# Patient Record
Sex: Female | Born: 1965
Health system: Southern US, Community
[De-identification: ages and names within clinical notes are randomized; demographics above are authoritative.]

## PROBLEM LIST (undated history)

## (undated) DIAGNOSIS — I10 Essential (primary) hypertension: Secondary | ICD-10-CM

## (undated) DIAGNOSIS — E7800 Pure hypercholesterolemia, unspecified: Secondary | ICD-10-CM

## (undated) DIAGNOSIS — K219 Gastro-esophageal reflux disease without esophagitis: Secondary | ICD-10-CM

## (undated) HISTORY — DX: Pure hypercholesterolemia, unspecified: E78.00

## (undated) HISTORY — DX: Essential (primary) hypertension: I10

---

## 2000-07-30 ENCOUNTER — Emergency Department (HOSPITAL_COMMUNITY): Admission: EM | Admit: 2000-07-30 | Discharge: 2000-07-30 | Payer: Self-pay | Admitting: Emergency Medicine

## 2003-10-15 ENCOUNTER — Emergency Department (HOSPITAL_COMMUNITY): Admission: EM | Admit: 2003-10-15 | Discharge: 2003-10-15 | Payer: Self-pay | Admitting: *Deleted

## 2003-11-18 ENCOUNTER — Encounter: Admission: RE | Admit: 2003-11-18 | Discharge: 2004-01-26 | Payer: Self-pay | Admitting: Orthopedic Surgery

## 2007-12-18 ENCOUNTER — Other Ambulatory Visit: Admission: RE | Admit: 2007-12-18 | Discharge: 2007-12-18 | Payer: Self-pay | Admitting: Obstetrics and Gynecology

## 2010-01-24 ENCOUNTER — Other Ambulatory Visit: Admission: RE | Admit: 2010-01-24 | Discharge: 2010-01-24 | Payer: Self-pay | Admitting: Obstetrics and Gynecology

## 2010-02-22 ENCOUNTER — Ambulatory Visit (HOSPITAL_COMMUNITY)
Admission: RE | Admit: 2010-02-22 | Discharge: 2010-02-22 | Payer: Self-pay | Source: Home / Self Care | Admitting: Obstetrics & Gynecology

## 2010-06-22 ENCOUNTER — Ambulatory Visit (HOSPITAL_COMMUNITY)
Admission: RE | Admit: 2010-06-22 | Discharge: 2010-06-22 | Payer: Self-pay | Source: Home / Self Care | Attending: Obstetrics & Gynecology | Admitting: Obstetrics & Gynecology

## 2010-07-11 ENCOUNTER — Encounter: Payer: Self-pay | Admitting: Obstetrics & Gynecology

## 2010-08-29 LAB — URINE MICROSCOPIC-ADD ON

## 2010-08-29 LAB — COMPREHENSIVE METABOLIC PANEL
Albumin: 3.7 g/dL (ref 3.5–5.2)
Alkaline Phosphatase: 147 U/L — ABNORMAL HIGH (ref 39–117)
BUN: 10 mg/dL (ref 6–23)
CO2: 26 mEq/L (ref 19–32)
Creatinine, Ser: 0.91 mg/dL (ref 0.4–1.2)
GFR calc non Af Amer: >60 mL/min (ref >60)
Total Bilirubin: 0.3 mg/dL (ref 0.3–1.2)

## 2010-08-29 LAB — CBC
Hemoglobin: 10.5 g/dL — ABNORMAL LOW (ref 12.0–15.0)
MCH: 24.3 pg — ABNORMAL LOW (ref 26.0–34.0)
MCHC: 33.1 g/dL (ref 30.0–36.0)
MCV: 73.4 fL — ABNORMAL LOW (ref 78.0–100.0)
RBC: 4.32 MIL/uL (ref 3.87–5.11)
WBC: 5.3 10*3/uL (ref 4.0–10.5)

## 2010-08-29 LAB — URINALYSIS, ROUTINE W REFLEX MICROSCOPIC
Bilirubin Urine: NEGATIVE
Glucose, UA: NEGATIVE mg/dL

## 2011-03-29 ENCOUNTER — Other Ambulatory Visit (HOSPITAL_COMMUNITY)
Admission: RE | Admit: 2011-03-29 | Discharge: 2011-03-29 | Disposition: A | Discharge status: Home / Self Care | Payer: Managed Care, Other (non HMO) | Source: Ambulatory Visit | Attending: Obstetrics and Gynecology | Admitting: Obstetrics and Gynecology

## 2011-03-29 ENCOUNTER — Other Ambulatory Visit: Payer: Self-pay | Admitting: Adult Health

## 2011-03-29 DIAGNOSIS — Z01419 Encounter for gynecological examination (general) (routine) without abnormal findings: Secondary | ICD-10-CM | POA: Insufficient documentation

## 2011-05-20 HISTORY — PX: HYSTEROSCOPY W/ ENDOMETRIAL ABLATION: SUR665

## 2012-04-25 IMAGING — US US TRANSVAGINAL NON-OB
1 series · 13 of 25 positions shown · non-contrast
Comparison: None.

CLINICAL DATA: History of dysfunctional uterine bleeding and
dysmenorrhea.



[Series 1: us transvaginal non-ob · 0.28mm/px · 13 of 96 slices shown]
[im 1/96]
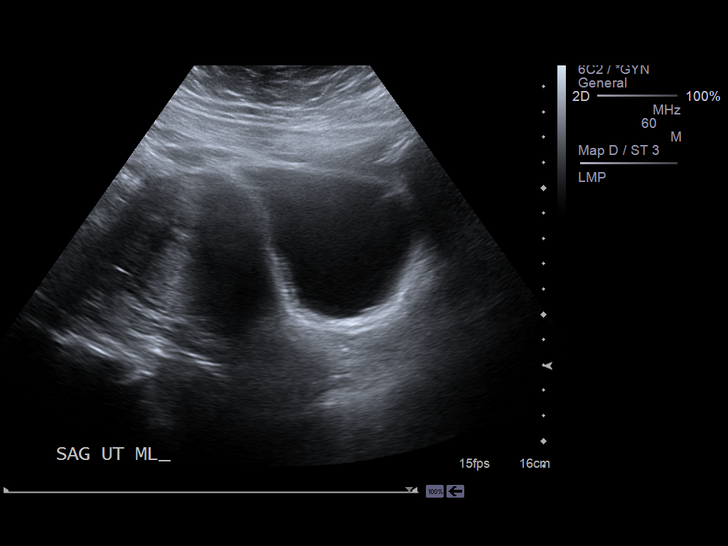
[im 8/96]
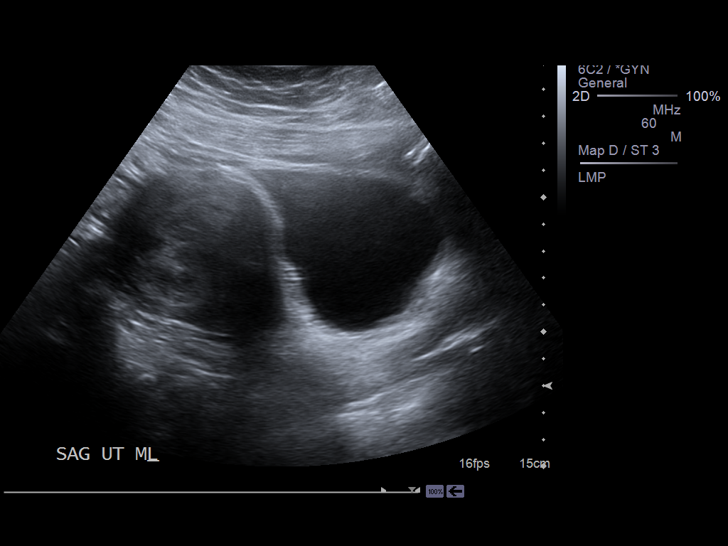
[im 16/96]
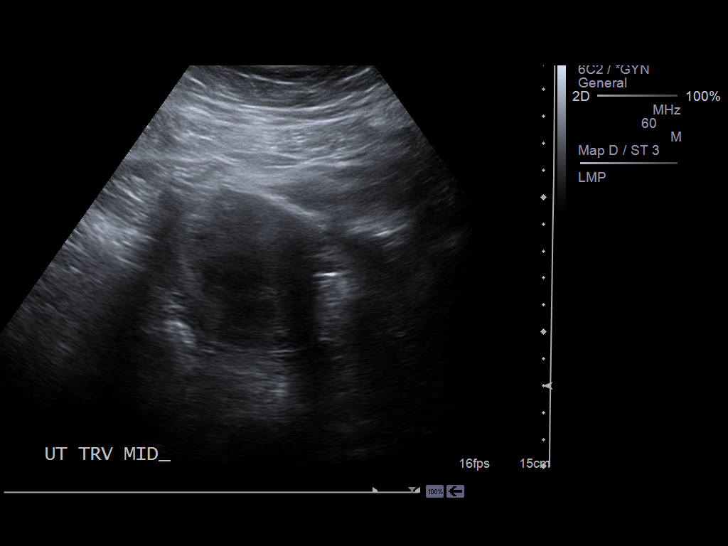
[im 24/96]
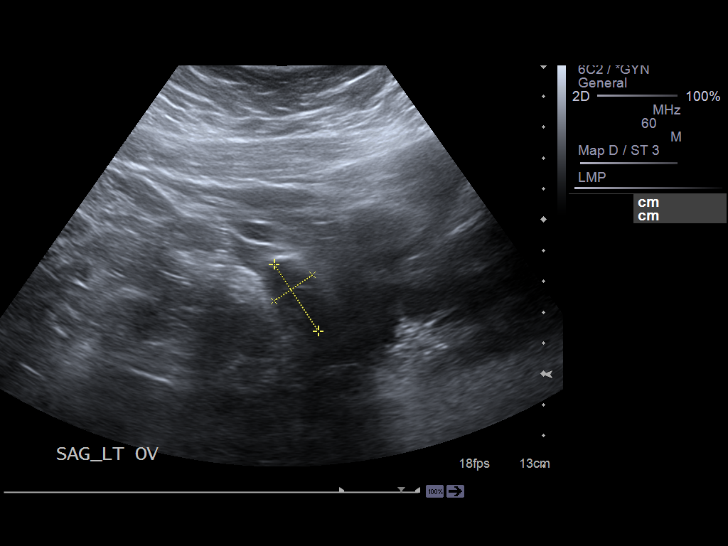
[im 32/96]
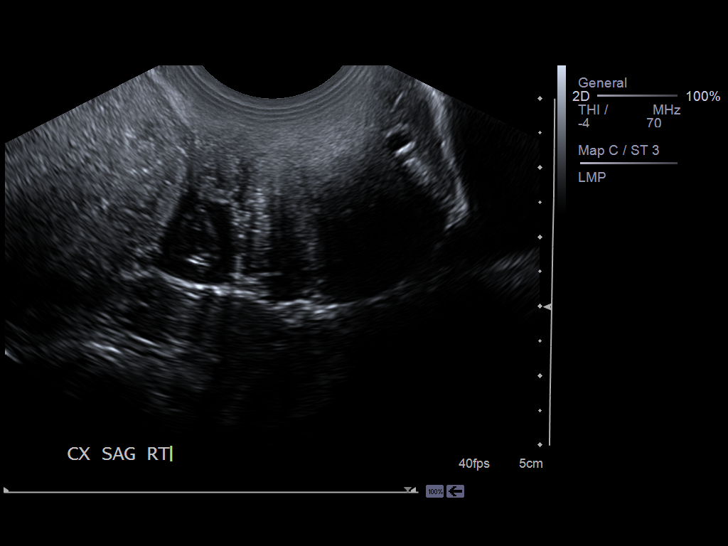
[im 40/96]
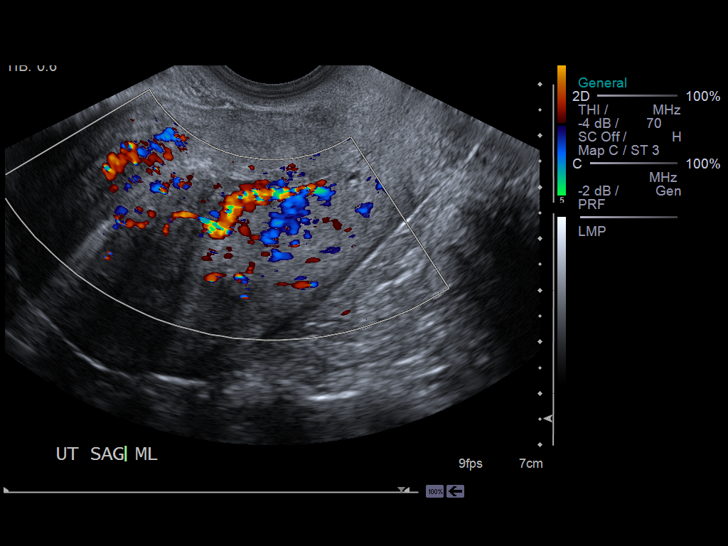
[im 48/96]
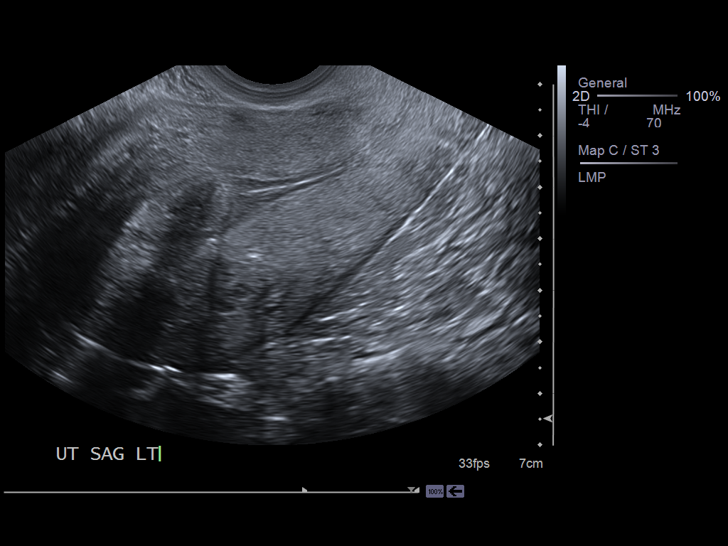
[im 56/96]
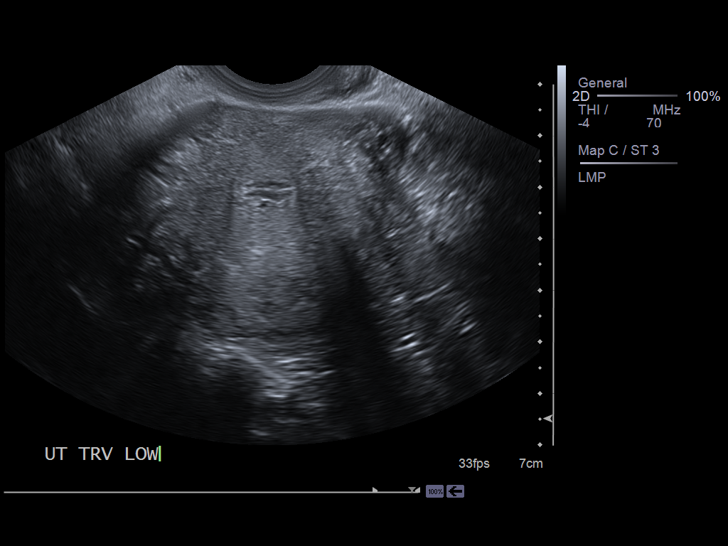
[im 64/96]
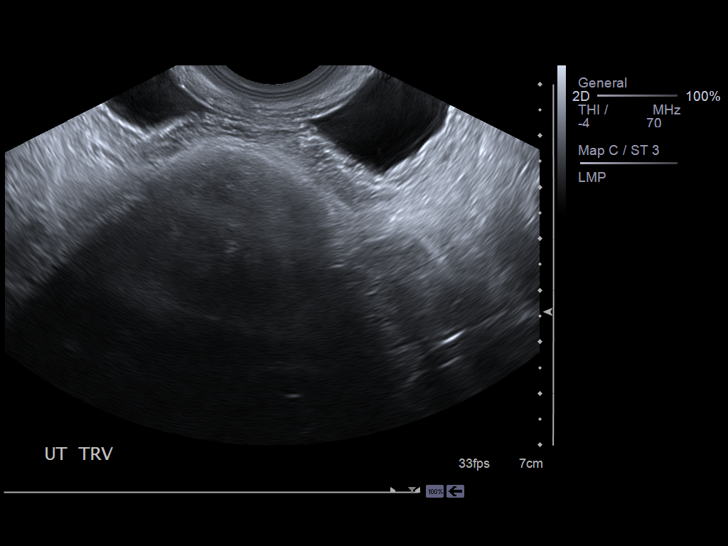
[im 72/96]
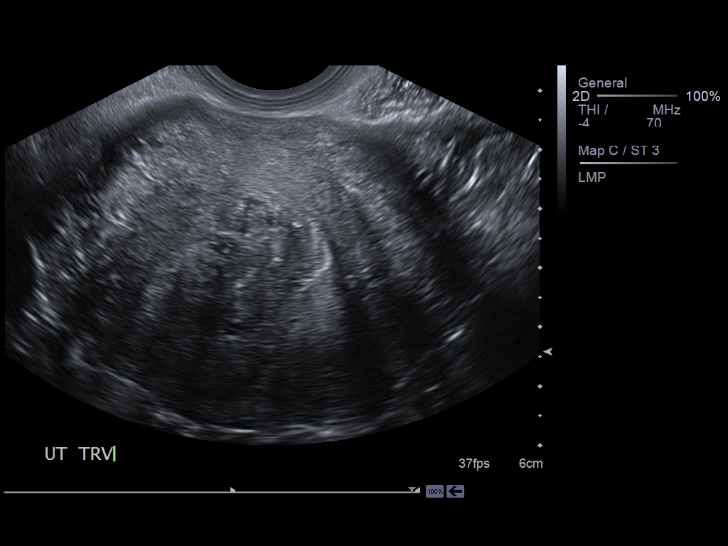
[im 80/96]
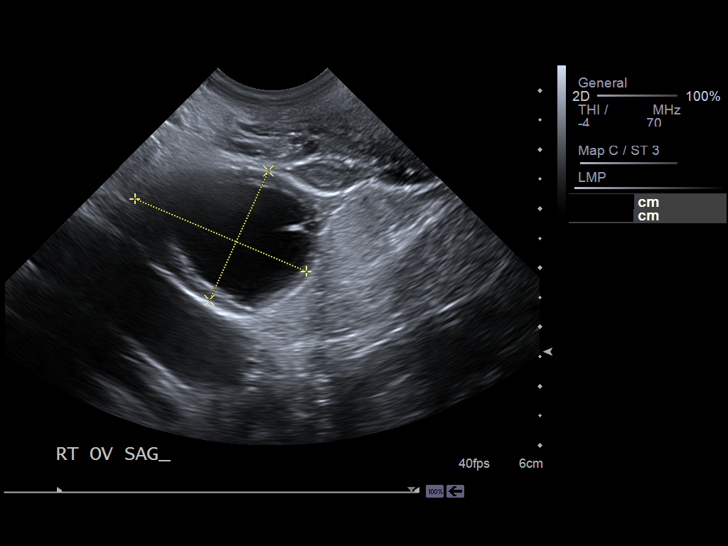
[im 88/96]
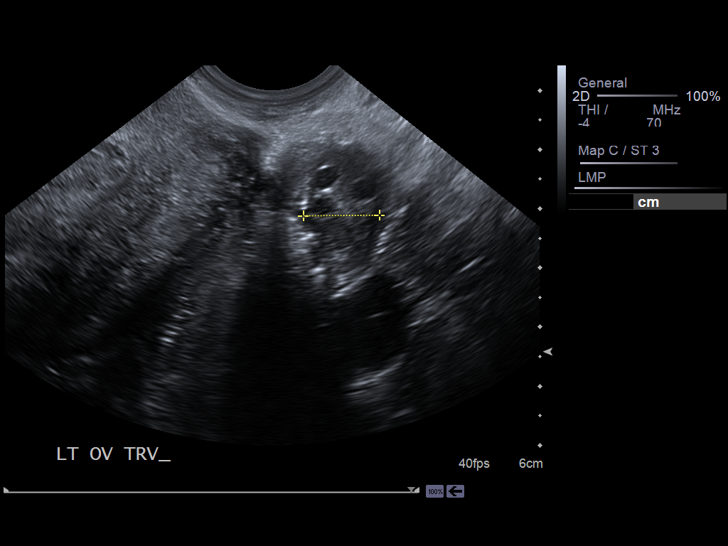
[im 96/96]
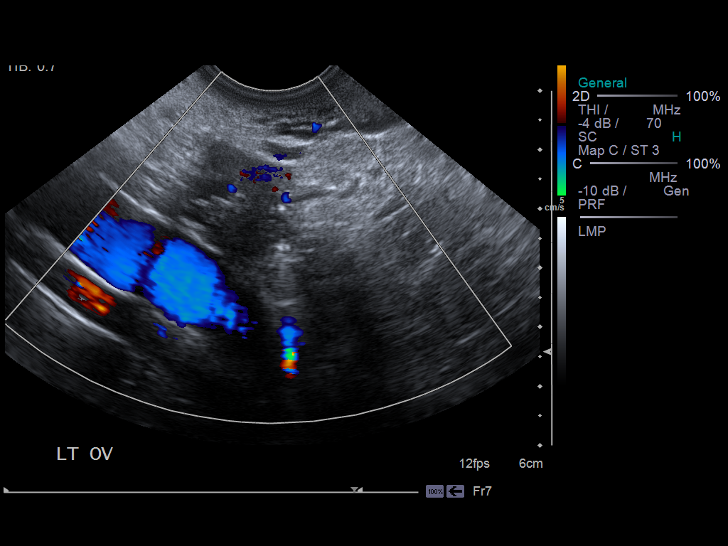

[13 of 25 positions shown; findings below may reference images not displayed]

FINDINGS: Uterus measures 10.5 x 5.1 x 6.7 cm.  There is intramural
myometrial mass consistent with uterine leiomyoma in the fundal
region.  The fibroid measures 1.5 by 1.5 x 1.3 cm.

Endometrium 1.6 cm.

There is a nodular area either reflecting polypoid endometrial mass
or submucosal uterine leiomyoma.  This mass measures 2.8 by 2.38 x
2.4 cm in diameter.

Right Ovary measures 3.1 x 2.4 x 2.3 cm.  No right ovarian lesion
is seen.

Left Ovary measures 2 x 1.5 x 1.3 cm.  No left ovarian lesion is
seen.

Other Findings:  There is a small amount of free fluid the pelvis.
No tubal abnormality is seen.
IMPRESSION: There is a small intramural uterine leiomyoma.

 There is a nodular polypoid mass density centrally within the
years.  I am uncertain whether this reflects an endometrial
polypoid mass or submucosal subendometrial uterine leiomyoma.
Hysterosonography or MRI may be helpful and may be considered if
differentiation is clinically important.

. No ovarian lesion was evident.

## 2019-02-10 ENCOUNTER — Ambulatory Visit: Payer: Self-pay | Admitting: Family Medicine

## 2019-04-08 ENCOUNTER — Ambulatory Visit: Payer: Self-pay | Admitting: Family Medicine

## 2019-05-06 ENCOUNTER — Ambulatory Visit: Payer: Self-pay | Admitting: Family Medicine

## 2019-05-06 ENCOUNTER — Other Ambulatory Visit: Payer: Self-pay

## 2019-05-26 NOTE — Progress Notes (Deleted)
   New Patient Office Visit  Assessment & Plan:  ***  Follow-up: No follow-ups on file.   Hendricks Limes, MSN, APRN, FNP-C [facility]  Subjective:  Patient ID: Catherine Walters, female    DOB: 02-16-1966  Age: 53 y.o. MRN: [id number]  No care team member to display  CC: No chief complaint on file.   HPI Catherine Walters presents to establish care. *** Patient also ***   ROS No current outpatient medications on file.  Not on File  No past medical history on file.  *** The histories are not reviewed yet. Please review them in the "History" navigator section and refresh this [place].  No family history on file.  Social History   Socioeconomic History  . Marital status: Married    Spouse name: Not on file  . Number of children: Not on file  . Years of education: Not on file  . Highest education level: Not on file  Occupational History  . Not on file  Social Needs  . Financial resource strain: Not on file  . Food insecurity    Worry: Not on file    Inability: Not on file  . Transportation needs    Medical: Not on file    Non-medical: Not on file  Tobacco Use  . Smoking status: Not on file  Substance and Sexual Activity  . Alcohol use: Not on file  . Drug use: Not on file  . Sexual activity: Not on file  Lifestyle  . Physical activity    Days per week: Not on file    Minutes per session: Not on file  . Stress: Not on file  Relationships  . Social Herbalist on phone: Not on file    Gets together: Not on file    Attends religious service: Not on file    Active member of club or organization: Not on file    Attends meetings of clubs or organizations: Not on file    Relationship status: Not on file  . Intimate partner violence    Fear of current or ex partner: Not on file    Emotionally abused: Not on file    Physically abused: Not on file    Forced sexual activity: Not on file  Other Topics Concern  . Not on file   Social History Narrative  . Not on file    Objective:   Today's Vitals: There were no vitals taken for this visit.  Physical Exam

## 2019-05-28 ENCOUNTER — Ambulatory Visit: Payer: Self-pay | Admitting: Family Medicine

## 2019-06-04 ENCOUNTER — Other Ambulatory Visit: Payer: Self-pay

## 2019-06-05 ENCOUNTER — Ambulatory Visit: Payer: 59 | Admitting: Family Medicine

## 2019-06-05 ENCOUNTER — Encounter: Payer: Self-pay | Admitting: Family Medicine

## 2019-06-05 VITALS — BP 118/78 | HR 108 | Temp 98.0°F | Ht 67.0 in | Wt 306.4 lb

## 2019-06-05 DIAGNOSIS — Z23 Encounter for immunization: Secondary | ICD-10-CM | POA: Diagnosis not present

## 2019-06-05 DIAGNOSIS — Z0000 Encounter for general adult medical examination without abnormal findings: Secondary | ICD-10-CM | POA: Diagnosis not present

## 2019-06-05 DIAGNOSIS — Z1211 Encounter for screening for malignant neoplasm of colon: Secondary | ICD-10-CM

## 2019-06-05 MED ORDER — SHINGRIX 50 MCG/0.5ML IM SUSR: 0.5000 mL | Freq: Once | INTRAMUSCULAR | 0 refills | Status: AC

## 2019-06-05 MED ORDER — TETANUS-DIPHTH-ACELL PERTUSSIS 5-2.5-18.5 LF-MCG/0.5 IM SUSP
0.5000 mL | Freq: Once | INTRAMUSCULAR | 0 refills | Status: AC
Start: 2019-06-05 — End: 2019-06-05

## 2019-06-05 NOTE — Progress Notes (Signed)
New Patient Office Visit  Assessment & Plan:  1. Well adult exam - Education provided on preventive care.  Patient to schedule appointment with OB/GYN for Pap smear.  Referral placed for first ever colonoscopy.  Mammogram scheduled for 07/16/2019 on the bus.  Shingrix and Tdap sent to the pharmacy for administration.  Patient declined influenza vaccine and HIV screening. - CBC with Differential/Platelet - CMP14+EGFR - Lipid Panel  2. Colon cancer screening - Ambulatory referral to Gastroenterology  3. Immunization due - SHINGRIX injection; Inject 0.5 mLs into the muscle once for 1 dose.  Dispense: 0.5 mL; Refill: 0 - Tdap (BOOSTRIX) 5-2.5-18.5 LF-MCG/0.5 injection; Inject 0.5 mLs into the muscle once for 1 dose.  Dispense: 0.5 mL; Refill: 0   Follow-up: Return in about 1 year (around 06/04/2020) for annual physical.   Hendricks Limes, MSN, APRN, FNP-C [facility]  Subjective:  Patient ID: Catherine Walters, female    DOB: 14-Jun-1966  Age: 53 y.o. MRN: [id number]  Patient Care Team: Loman Brooklyn, FNP as PCP - General (Family Medicine)  CC:  Chief Complaint  Patient presents with  . New Patient (Initial Visit)    HPI Catherine Walters presents to establish care. She is also in need of her annual physical.   She has no complaints or concerns today.   Review of Systems  Constitutional: Negative for chills, fever, malaise/fatigue and weight loss.  HENT: Negative for congestion, ear discharge, ear pain, nosebleeds, sinus pain, sore throat and tinnitus.   Eyes: Negative for blurred vision, double vision, pain, discharge and redness.  Respiratory: Negative for cough, shortness of breath and wheezing.   Cardiovascular: Negative for chest pain, palpitations and leg swelling.  Gastrointestinal: Negative for abdominal pain, constipation, diarrhea, heartburn, nausea and vomiting.  Genitourinary: Negative for dysuria, frequency and urgency.  Musculoskeletal:  Negative for myalgias.  Skin: Negative for rash.  Neurological: Negative for dizziness, seizures, weakness and headaches.  Psychiatric/Behavioral: Negative for depression, substance abuse and suicidal ideas. The patient is not nervous/anxious.    No current outpatient medications on file.  Allergies  Allergen Reactions  . Entex T  [Pseudoephedrine-Guaifenesin] Hives  . Shellfish Allergy Nausea And Vomiting    History reviewed. No pertinent past medical history.  Past Surgical History:  Procedure Laterality Date  . DILATION AND CURETTAGE OF UTERUS  05/2011    Family History  Problem Relation Age of Onset  . Diabetes Mother   . Hypertension Mother   . Thyroid disease Mother   . Arthritis Mother   . Thyroid disease Sister   . Thyroid disease Brother   . Diabetes Brother   . Ovarian cancer Maternal Grandmother   . Diabetes Maternal Grandmother   . Hypertension Maternal Grandmother   . Dementia Paternal Grandfather   . Mental illness Son     Social History   Socioeconomic History  . Marital status: Married    Spouse name: Not on file  . Number of children: Not on file  . Years of education: Not on file  . Highest education level: Not on file  Occupational History  . Not on file  Tobacco Use  . Smoking status: Former Smoker    Types: Cigarettes  . Smokeless tobacco: Never Used  . Tobacco comment: 17 years ago  Substance and Sexual Activity  . Alcohol use: Not Currently  . Drug use: Never  . Sexual activity: Not Currently  Other Topics Concern  . Not on file  Social History Narrative  .  Not on file   Social Determinants of Health   Financial Resource Strain:   . Difficulty of Paying Living Expenses: Not on file  Food Insecurity:   . Worried About Charity fundraiser in the Last Year: Not on file  . Ran Out of Food in the Last Year: Not on file  Transportation Needs:   . Lack of Transportation (Medical): Not on file  . Lack of Transportation (Non-Medical):  Not on file  Physical Activity:   . Days of Exercise per Week: Not on file  . Minutes of Exercise per Session: Not on file  Stress:   . Feeling of Stress : Not on file  Social Connections:   . Frequency of Communication with Friends and Family: Not on file  . Frequency of Social Gatherings with Friends and Family: Not on file  . Attends Religious Services: Not on file  . Active Member of Clubs or Organizations: Not on file  . Attends Archivist Meetings: Not on file  . Marital Status: Not on file  Intimate Partner Violence:   . Fear of Current or Ex-Partner: Not on file  . Emotionally Abused: Not on file  . Physically Abused: Not on file  . Sexually Abused: Not on file    Objective:   Today's Vitals: BP 118/78 Comment: manual  Pulse (!) 108   Temp 98 F (36.7 C) (Temporal)   Ht 5' 7" (1.702 m)   Wt (!) 306 lb 6.4 oz (139 kg)   SpO2 96%   BMI 47.99 kg/m   Physical Exam Vitals reviewed.  Constitutional:      General: She is not in acute distress.    Appearance: Normal appearance. She is morbidly obese. She is not ill-appearing, toxic-appearing or diaphoretic.  HENT:     Head: Normocephalic and atraumatic.     Right Ear: Tympanic membrane, ear canal and external ear normal. There is no impacted cerumen.     Left Ear: Tympanic membrane, ear canal and external ear normal. There is no impacted cerumen.     Nose: Nose normal. No congestion or rhinorrhea.     Mouth/Throat:     Mouth: Mucous membranes are moist.     Pharynx: Oropharynx is clear. No oropharyngeal exudate or posterior oropharyngeal erythema.  Eyes:     General: No scleral icterus.       Right eye: No discharge.        Left eye: No discharge.     Conjunctiva/sclera: Conjunctivae normal.     Pupils: Pupils are equal, round, and reactive to light.  Cardiovascular:     Rate and Rhythm: Normal rate and regular rhythm.     Heart sounds: Normal heart sounds. No murmur. No friction rub. No gallop.     Pulmonary:     Effort: Pulmonary effort is normal. No respiratory distress.     Breath sounds: Normal breath sounds. No stridor. No wheezing, rhonchi or rales.  Abdominal:     General: Abdomen is flat. Bowel sounds are normal. There is no distension.     Palpations: Abdomen is soft. There is no mass.     Tenderness: There is no abdominal tenderness. There is no guarding or rebound.     Hernia: No hernia is present.  Musculoskeletal:        General: Normal range of motion.     Cervical back: Normal range of motion and neck supple. No rigidity. No muscular tenderness.  Lymphadenopathy:     Cervical:  No cervical adenopathy.  Skin:    General: Skin is warm and dry.     Capillary Refill: Capillary refill takes less than 2 seconds.  Neurological:     General: No focal deficit present.     Mental Status: She is alert and oriented to person, place, and time. Mental status is at baseline.  Psychiatric:        Mood and Affect: Mood normal.        Behavior: Behavior normal.        Thought Content: Thought content normal.        Judgment: Judgment normal.

## 2019-06-05 NOTE — Patient Instructions (Addendum)
Schedule appointment with OBGYN for pap smear - [facility] in [place].    Preventive Care 21-51 Years Old, Female Preventive care refers to visits with your health care provider and lifestyle choices that can promote health and wellness. This includes:  A yearly physical exam. This may also be called an annual well check.  Regular dental visits and eye exams.  Immunizations.  Screening for certain conditions.  Healthy lifestyle choices, such as eating a healthy diet, getting regular exercise, not using drugs or products that contain nicotine and tobacco, and limiting alcohol use. What can I expect for my preventive care visit? Physical exam Your health care provider will check your:  Height and weight. This may be used to calculate body mass index (BMI), which tells if you are at a healthy weight.  Heart rate and blood pressure.  Skin for abnormal spots. Counseling Your health care provider may ask you questions about your:  Alcohol, tobacco, and drug use.  Emotional well-being.  Home and relationship well-being.  Sexual activity.  Eating habits.  Work and work Statistician.  Method of birth control.  Menstrual cycle.  Pregnancy history. What immunizations do I need?  Influenza (flu) vaccine  This is recommended every year. Tetanus, diphtheria, and pertussis (Tdap) vaccine  You may need a Td booster every 10 years. Varicella (chickenpox) vaccine  You may need this if you have not been vaccinated. Zoster (shingles) vaccine  You may need this after age 55. Measles, mumps, and rubella (MMR) vaccine  You may need at least one dose of MMR if you were born in 1957 or later. You may also need a second dose. Pneumococcal conjugate (PCV13) vaccine  You may need this if you have certain conditions and were not previously vaccinated. Pneumococcal polysaccharide (PPSV23) vaccine  You may need one or two doses if you smoke cigarettes or if you have  certain conditions. Meningococcal conjugate (MenACWY) vaccine  You may need this if you have certain conditions. Hepatitis A vaccine  You may need this if you have certain conditions or if you travel or work in places where you may be exposed to hepatitis A. Hepatitis B vaccine  You may need this if you have certain conditions or if you travel or work in places where you may be exposed to hepatitis B. Haemophilus influenzae type b (Hib) vaccine  You may need this if you have certain conditions. Human papillomavirus (HPV) vaccine  If recommended by your health care provider, you may need three doses over 6 months. You may receive vaccines as individual doses or as more than one vaccine together in one shot (combination vaccines). Talk with your health care provider about the risks and benefits of combination vaccines. What tests do I need? Blood tests  Lipid and cholesterol levels. These may be checked every 5 years, or more frequently if you are over 56 years old.  Hepatitis C test.  Hepatitis B test. Screening  Lung cancer screening. You may have this screening every year starting at age 42 if you have a 30-pack-year history of smoking and currently smoke or have quit within the past 15 years.  Colorectal cancer screening. All adults should have this screening starting at age 87 and continuing until age 65. Your health care provider may recommend screening at age 65 if you are at increased risk. You will have tests every 1-10 years, depending on your results and the type of screening test.  Diabetes screening. This is done by checking your  blood sugar (glucose) after you have not eaten for a while (fasting). You may have this done every 1-3 years.  Mammogram. This may be done every 1-2 years. Talk with your health care provider about when you should start having regular mammograms. This may depend on whether you have a family history of breast cancer.  BRCA-related cancer  screening. This may be done if you have a family history of breast, ovarian, tubal, or peritoneal cancers.  Pelvic exam and Pap test. This may be done every 3 years starting at age 36. Starting at age 67, this may be done every 5 years if you have a Pap test in combination with an HPV test. Other tests  Sexually transmitted disease (STD) testing.  Bone density scan. This is done to screen for osteoporosis. You may have this scan if you are at high risk for osteoporosis. Follow these instructions at home: Eating and drinking  Eat a diet that includes fresh fruits and vegetables, whole grains, lean protein, and low-fat dairy.  Take vitamin and mineral supplements as recommended by your health care provider.  Do not drink alcohol if: ? Your health care provider tells you not to drink. ? You are pregnant, may be pregnant, or are planning to become pregnant.  If you drink alcohol: ? Limit how much you have to 0-1 drink a day. ? Be aware of how much alcohol is in your drink. In the [place] one drink equals one 12 oz bottle of beer (355 mL), one 5 oz glass of wine (148 mL), or one 1 oz glass of hard liquor (44 mL). Lifestyle  Take daily care of your teeth and gums.  Stay active. Exercise for at least 30 minutes on 5 or more days each week.  Do not use any products that contain nicotine or tobacco, such as cigarettes, e-cigarettes, and chewing tobacco. If you need help quitting, ask your health care provider.  If you are sexually active, practice safe sex. Use a condom or other form of birth control (contraception) in order to prevent pregnancy and STIs (sexually transmitted infections).  If told by your health care provider, take low-dose aspirin daily starting at age 16. What's next?  Visit your health care provider once a year for a well check visit.  Ask your health care provider how often you should have your eyes and teeth checked.  Stay up to date on all vaccines. This  information is not intended to replace advice given to you by your health care provider. Make sure you discuss any questions you have with your health care provider. Document Released: 07/02/2015 Document Revised: 02/14/2018 Document Reviewed: 02/14/2018 Elsevier Patient Education  2020 [employer].    Your provider wants you to schedule an appointment with a Psychologist/Psychiatrist. The following list of offices requires the patient to call and make their own appointment, as there is information they need that only you can provide. Please feel free to choose form the following providers:  [facility] in [place]  [place]  [phone] [place], [place]  (Scheduled through [facility]) Must call and do an interview for appointment. Sees Children / Accepts Medicaid  [facility] [place]  [address], [place], [place]  [phone] [place], [place] for Autism but does not treat it  Sees Children / Accepts Medicaid  [facility]    [phone] [address], [address]   [place], [place] Medication management, substance abuse, bipolar, grief, family, marriage, OCD, anxiety, PTSD Sees children / Accepts Medicaid  [place]    [phone] [address] [place], [place] children / Accepts [facility]  [facility]  [phone] [address] [place], [place]   Dr Lorenza Evangelist     [phone] [address] [place], [place]  Sees ADD & ADHD for treatment Accepts Medicaid  [facility]  [phone] [address] [place], [place] Evaluates for Autism Accepts [facility]  [facility]  [phone] [address] [place], [place]  Does Adult ADD evaluations Does not accept  Medicaid  [facility]   [phone] [place], [place] children as young as 68 years old Accepts [facility]     [phone]    [place], [place] [address] Sees children Accepts Medicaid

## 2019-06-06 LAB — CBC WITH DIFFERENTIAL/PLATELET
Basophils Absolute: 0.1 10*3/uL (ref 0.0–0.2)
Basos: 1 %
EOS (ABSOLUTE): 0.1 10*3/uL (ref 0.0–0.4)
Eos: 1 %
Hematocrit: 39.1 % (ref 34.0–46.6)
Hemoglobin: 12.9 g/dL (ref 11.1–15.9)
Immature Grans (Abs): 0 10*3/uL (ref 0.0–0.1)
Immature Granulocytes: 0 %
Lymphocytes Absolute: 2.5 10*3/uL (ref 0.7–3.1)
Lymphs: 38 %
MCH: 26.8 pg (ref 26.6–33.0)
MCHC: 33 g/dL (ref 31.5–35.7)
MCV: 81 fL (ref 79–97)
Monocytes Absolute: 0.5 10*3/uL (ref 0.1–0.9)
Monocytes: 8 %
Neutrophils Absolute: 3.4 10*3/uL (ref 1.4–7.0)
Neutrophils: 52 %
Platelets: 421 10*3/uL (ref 150–450)
RBC: 4.82 x10E6/uL (ref 3.77–5.28)
RDW: 13.2 % (ref 11.7–15.4)
WBC: 6.5 10*3/uL (ref 3.4–10.8)

## 2019-06-06 LAB — CMP14+EGFR
ALT: 16 IU/L (ref 0–32)
AST: 14 IU/L (ref 0–40)
Albumin/Globulin Ratio: 1.3 (ref 1.2–2.2)
Albumin: 4.1 g/dL (ref 3.8–4.9)
Alkaline Phosphatase: 162 IU/L — ABNORMAL HIGH (ref 39–117)
BUN/Creatinine Ratio: 11 (ref 9–23)
BUN: 11 mg/dL (ref 6–24)
Bilirubin Total: 0.2 mg/dL (ref 0.0–1.2)
CO2: 24 mmol/L (ref 20–29)
Calcium: 9.4 mg/dL (ref 8.7–10.2)
Chloride: 104 mmol/L (ref 96–106)
Creatinine, Ser: 0.99 mg/dL (ref 0.57–1.00)
GFR calc Af Amer: 76 mL/min/{1.73_m2} (ref >59)
GFR calc non Af Amer: 66 mL/min/{1.73_m2} (ref >59)
Globulin, Total: 3.1 g/dL (ref 1.5–4.5)
Glucose: 116 mg/dL — ABNORMAL HIGH (ref 65–99)
Potassium: 3.9 mmol/L (ref 3.5–5.2)
Sodium: 142 mmol/L (ref 134–144)
Total Protein: 7.2 g/dL (ref 6.0–8.5)

## 2019-06-06 LAB — LIPID PANEL
Chol/HDL Ratio: 2.4 ratio (ref 0.0–4.4)
Cholesterol, Total: 105 mg/dL (ref 100–199)
HDL: 44 mg/dL (ref >39)
LDL Chol Calc (NIH): 49 mg/dL (ref 0–99)
Triglycerides: 50 mg/dL (ref 0–149)
VLDL Cholesterol Cal: 12 mg/dL (ref 5–40)

## 2019-06-10 ENCOUNTER — Encounter: Payer: Self-pay | Admitting: Family Medicine

## 2019-08-08 ENCOUNTER — Encounter: Payer: Self-pay | Admitting: Family Medicine

## 2020-05-12 ENCOUNTER — Encounter: Payer: Self-pay | Admitting: *Deleted

## 2020-06-17 ENCOUNTER — Encounter: Payer: 59 | Admitting: Family Medicine

## 2020-07-16 ENCOUNTER — Ambulatory Visit (INDEPENDENT_AMBULATORY_CARE_PROVIDER_SITE_OTHER): Payer: 59 | Admitting: Family Medicine

## 2020-07-16 ENCOUNTER — Encounter: Payer: Self-pay | Admitting: Family Medicine

## 2020-07-16 ENCOUNTER — Other Ambulatory Visit: Payer: Self-pay

## 2020-07-16 VITALS — BP 133/79 | HR 75 | Temp 96.3°F | Ht 67.0 in | Wt 294.2 lb

## 2020-07-16 DIAGNOSIS — Z1211 Encounter for screening for malignant neoplasm of colon: Secondary | ICD-10-CM | POA: Diagnosis not present

## 2020-07-16 DIAGNOSIS — Z0000 Encounter for general adult medical examination without abnormal findings: Secondary | ICD-10-CM | POA: Diagnosis not present

## 2020-07-16 LAB — CMP14+EGFR
ALT: 12 IU/L (ref 0–32)
AST: 12 IU/L (ref 0–40)
Albumin/Globulin Ratio: 1.5 (ref 1.2–2.2)
Albumin: 4 g/dL (ref 3.8–4.9)
Alkaline Phosphatase: 152 IU/L — ABNORMAL HIGH (ref 44–121)
BUN/Creatinine Ratio: 12 (ref 9–23)
BUN: 10 mg/dL (ref 6–24)
Bilirubin Total: 0.3 mg/dL (ref 0.0–1.2)
CO2: 25 mmol/L (ref 20–29)
Calcium: 8.6 mg/dL — ABNORMAL LOW (ref 8.7–10.2)
Chloride: 105 mmol/L (ref 96–106)
Creatinine, Ser: 0.82 mg/dL (ref 0.57–1.00)
GFR calc Af Amer: 95 mL/min/{1.73_m2} (ref >59)
GFR calc non Af Amer: 83 mL/min/{1.73_m2} (ref >59)
Globulin, Total: 2.7 g/dL (ref 1.5–4.5)
Glucose: 102 mg/dL — ABNORMAL HIGH (ref 65–99)
Potassium: 4.7 mmol/L (ref 3.5–5.2)
Sodium: 142 mmol/L (ref 134–144)
Total Protein: 6.7 g/dL (ref 6.0–8.5)

## 2020-07-16 LAB — LIPID PANEL
Chol/HDL Ratio: 2.5 ratio (ref 0.0–4.4)
Cholesterol, Total: 142 mg/dL (ref 100–199)
HDL: 56 mg/dL (ref >39)
LDL Chol Calc (NIH): 73 mg/dL (ref 0–99)
Triglycerides: 62 mg/dL (ref 0–149)
VLDL Cholesterol Cal: 13 mg/dL (ref 5–40)

## 2020-07-16 LAB — CBC WITH DIFFERENTIAL/PLATELET
Basophils Absolute: 0 10*3/uL (ref 0.0–0.2)
Basos: 1 %
EOS (ABSOLUTE): 0.1 10*3/uL (ref 0.0–0.4)
Eos: 2 %
Hematocrit: 37.1 % (ref 34.0–46.6)
Hemoglobin: 12.2 g/dL (ref 11.1–15.9)
Immature Grans (Abs): 0 10*3/uL (ref 0.0–0.1)
Immature Granulocytes: 0 %
Lymphocytes Absolute: 1.7 10*3/uL (ref 0.7–3.1)
Lymphs: 38 %
MCH: 27.3 pg (ref 26.6–33.0)
MCHC: 32.9 g/dL (ref 31.5–35.7)
MCV: 83 fL (ref 79–97)
Monocytes Absolute: 0.3 10*3/uL (ref 0.1–0.9)
Monocytes: 8 %
Neutrophils Absolute: 2.2 10*3/uL (ref 1.4–7.0)
Neutrophils: 51 %
Platelets: 312 10*3/uL (ref 150–450)
RBC: 4.47 x10E6/uL (ref 3.77–5.28)
RDW: 12.9 % (ref 11.7–15.4)
WBC: 4.3 10*3/uL (ref 3.4–10.8)

## 2020-07-16 NOTE — Progress Notes (Signed)
Assessment & Plan:  1. Well adult exam - Preventive health education provided. TDAP and shingles were previously sent to the pharmacy for administration after they price check through her insurance.  Patient is going to schedule her pap smear at [facility].  She has been referred for a colonoscopy as she is not interested in Cologuard.  She had her mammogram completed a year ago and would like to have them every 2 years.  She declines HIV and hepatitis C screening and flu shot. - CBC with Differential/Platelet - CMP14+EGFR - Lipid panel  2. Colon cancer screening - Ambulatory referral to Gastroenterology   Follow-up: Return in about 1 year (around 07/16/2021) for annual physical.   Hendricks Limes, MSN, APRN, FNP-C [facility]  Subjective:  Patient ID: Catherine Walters, female    DOB: 28-Dec-1965  Age: 55 y.o. MRN: [id number]  Patient Care Team: Loman Brooklyn, FNP as PCP - General (Family Medicine)   CC:  Chief Complaint  Patient presents with  . Annual Exam    HPI Catherine Walters presents for her annual physical.  Occupation: Optician, dispensing, Marital status: Married, Substance use: None Diet: Healthy - lots of grilled or baked foods and fruit, Exercise: walks 5 to 10 miles when it is warm out Last colonoscopy: Never Last mammogram: 07/16/2019 Last pap smear: 2012 Hepatitis C Screening: Declined Immunizations: Flu Vaccine: declined Tdap Vaccine: at the pharmacy for administration, so it can be run through insurance first   Shingrix Vaccine: at the pharmacy for administration, so it can be run through insurance first   COVID-19 Vaccine: up to date  [place] 2/9 Scores 07/16/2020 06/05/2019  PHQ - 2 Score 0 0     Review of Systems  Constitutional: Negative for chills, fever, malaise/fatigue and weight loss.  HENT: Negative for congestion, ear discharge, ear pain, nosebleeds, sinus pain, sore throat and tinnitus.   Eyes:  Negative for blurred vision, double vision, pain, discharge and redness.  Respiratory: Negative for cough, shortness of breath and wheezing.   Cardiovascular: Negative for chest pain, palpitations and leg swelling.  Gastrointestinal: Negative for abdominal pain, constipation, diarrhea, heartburn, nausea and vomiting.  Genitourinary: Negative for dysuria, frequency and urgency.  Musculoskeletal: Negative for myalgias.  Skin: Negative for rash.  Neurological: Negative for dizziness, seizures, weakness and headaches.  Psychiatric/Behavioral: Negative for depression, substance abuse and suicidal ideas. The patient is not nervous/anxious.     No current outpatient medications on file.  Allergies  Allergen Reactions  . Entex T  [Pseudoephedrine-Guaifenesin] Hives  . Shellfish Allergy Nausea And Vomiting    History reviewed. No pertinent past medical history.  Past Surgical History:  Procedure Laterality Date  . DILATION AND CURETTAGE OF UTERUS  05/2011    Family History  Problem Relation Age of Onset  . Diabetes Mother   . Hypertension Mother   . Thyroid disease Mother   . Arthritis Mother   . Thyroid disease Sister   . Thyroid disease Brother   . Diabetes Brother   . Ovarian cancer Maternal Grandmother   . Diabetes Maternal Grandmother   . Hypertension Maternal Grandmother   . Dementia Paternal Grandfather   . Mental illness Son     Social History   Socioeconomic History  . Marital status: Married    Spouse name: Not on file  . Number of children: Not on file  . Years of education: Not on file  . Highest education level: Not on  file  Occupational History  . Not on file  Tobacco Use  . Smoking status: Former Smoker    Types: Cigarettes  . Smokeless tobacco: Never Used  . Tobacco comment: 17 years ago  Vaping Use  . Vaping Use: Never used  Substance and Sexual Activity  . Alcohol use: Not Currently  . Drug use: Never  . Sexual activity: Not Currently  Other  Topics Concern  . Not on file  Social History Narrative  . Not on file   Social Determinants of Health   Financial Resource Strain: Not on file  Food Insecurity: Not on file  Transportation Needs: Not on file  Physical Activity: Not on file  Stress: Not on file  Social Connections: Not on file  Intimate Partner Violence: Not on file      Objective:    BP 133/79   Pulse 75   Temp (!) 96.3 F (35.7 C) (Temporal)   Ht 5' 7"  (1.702 m)   Wt 294 lb 3.2 oz (133.4 kg)   SpO2 100%   BMI 46.08 kg/m   Wt Readings from Last 3 Encounters:  07/16/20 294 lb 3.2 oz (133.4 kg)  06/05/19 (!) 306 lb 6.4 oz (139 kg)    Physical Exam Vitals reviewed.  Constitutional:      General: She is not in acute distress.    Appearance: Normal appearance. She is morbidly obese. She is not ill-appearing, toxic-appearing or diaphoretic.  HENT:     Head: Normocephalic and atraumatic.     Right Ear: Tympanic membrane, ear canal and external ear normal. There is no impacted cerumen.     Left Ear: Tympanic membrane, ear canal and external ear normal. There is no impacted cerumen.     Nose: Nose normal. No congestion or rhinorrhea.     Mouth/Throat:     Mouth: Mucous membranes are moist.     Pharynx: Oropharynx is clear. No oropharyngeal exudate or posterior oropharyngeal erythema.  Eyes:     General: No scleral icterus.       Right eye: No discharge.        Left eye: No discharge.     Conjunctiva/sclera: Conjunctivae normal.     Pupils: Pupils are equal, round, and reactive to light.  Cardiovascular:     Rate and Rhythm: Normal rate and regular rhythm.     Heart sounds: Normal heart sounds. No murmur heard. No friction rub. No gallop.   Pulmonary:     Effort: Pulmonary effort is normal. No respiratory distress.     Breath sounds: Normal breath sounds. No stridor. No wheezing, rhonchi or rales.  Abdominal:     General: Abdomen is flat. Bowel sounds are normal. There is no distension.      Palpations: Abdomen is soft. There is no mass.     Tenderness: There is no abdominal tenderness. There is no guarding or rebound.     Hernia: No hernia is present.  Musculoskeletal:        General: Normal range of motion.     Cervical back: Normal range of motion and neck supple. No rigidity. No muscular tenderness.  Lymphadenopathy:     Cervical: No cervical adenopathy.  Skin:    General: Skin is warm and dry.     Capillary Refill: Capillary refill takes less than 2 seconds.  Neurological:     General: No focal deficit present.     Mental Status: She is alert and oriented to person, place, and time. Mental status  is at baseline.  Psychiatric:        Mood and Affect: Mood normal.        Behavior: Behavior normal.        Thought Content: Thought content normal.        Judgment: Judgment normal.     No results found for: TSH Lab Results  Component Value Date   WBC 6.5 06/05/2019   HGB 12.9 06/05/2019   HCT 39.1 06/05/2019   MCV 81 06/05/2019   PLT 421 06/05/2019   Lab Results  Component Value Date   NA 142 06/05/2019   K 3.9 06/05/2019   CO2 24 06/05/2019   GLUCOSE 116 (H) 06/05/2019   BUN 11 06/05/2019   CREATININE 0.99 06/05/2019   BILITOT 0.2 06/05/2019   ALKPHOS 162 (H) 06/05/2019   AST 14 06/05/2019   ALT 16 06/05/2019   PROT 7.2 06/05/2019   ALBUMIN 4.1 06/05/2019   CALCIUM 9.4 06/05/2019   Lab Results  Component Value Date   CHOL 105 06/05/2019   Lab Results  Component Value Date   HDL 44 06/05/2019   Lab Results  Component Value Date   LDLCALC 49 06/05/2019   Lab Results  Component Value Date   TRIG 50 06/05/2019   Lab Results  Component Value Date   CHOLHDL 2.4 06/05/2019   No results found for: HGBA1C

## 2020-07-16 NOTE — Patient Instructions (Signed)
Your tetanus and shingles vaccines are at the pharmacy - ask them about pricing before you get them.  Schedule your pap smear with [facility].  I am referring you for a colonoscopy.   Preventive Care 19-53 Years Old, Female Preventive care refers to lifestyle choices and visits with your health care provider that can promote health and wellness. This includes:  A yearly physical exam. This is also called an annual wellness visit.  Regular dental and eye exams.  Immunizations.  Screening for certain conditions.  Healthy lifestyle choices, such as: ? Eating a healthy diet. ? Getting regular exercise. ? Not using drugs or products that contain nicotine and tobacco. ? Limiting alcohol use. What can I expect for my preventive care visit? Physical exam Your health care provider will check your:  Height and weight. These may be used to calculate your BMI (body mass index). BMI is a measurement that tells if you are at a healthy weight.  Heart rate and blood pressure.  Body temperature.  Skin for abnormal spots. Counseling Your health care provider may ask you questions about your:  Past medical problems.  Family's medical history.  Alcohol, tobacco, and drug use.  Emotional well-being.  Home life and relationship well-being.  Sexual activity.  Diet, exercise, and sleep habits.  Work and work Statistician.  Access to firearms.  Method of birth control.  Menstrual cycle.  Pregnancy history. What immunizations do I need? Vaccines are usually given at various ages, according to a schedule. Your health care provider will recommend vaccines for you based on your age, medical history, and lifestyle or other factors, such as travel or where you work.   What tests do I need? Blood tests  Lipid and cholesterol levels. These may be checked every 5 years, or more often if you are over 45 years old.  Hepatitis C test.  Hepatitis B test. Screening  Lung cancer  screening. You may have this screening every year starting at age 53 if you have a 30-pack-year history of smoking and currently smoke or have quit within the past 15 years.  Colorectal cancer screening. ? All adults should have this screening starting at age 59 and continuing until age 30. ? Your health care provider may recommend screening at age 74 if you are at increased risk. ? You will have tests every 1-10 years, depending on your results and the type of screening test.  Diabetes screening. ? This is done by checking your blood sugar (glucose) after you have not eaten for a while (fasting). ? You may have this done every 1-3 years.  Mammogram. ? This may be done every 1-2 years. ? Talk with your health care provider about when you should start having regular mammograms. This may depend on whether you have a family history of breast cancer.  BRCA-related cancer screening. This may be done if you have a family history of breast, ovarian, tubal, or peritoneal cancers.  Pelvic exam and Pap test. ? This may be done every 3 years starting at age 26. ? Starting at age 15, this may be done every 5 years if you have a Pap test in combination with an HPV test. Other tests  STD (sexually transmitted disease) testing, if you are at risk.  Bone density scan. This is done to screen for osteoporosis. You may have this scan if you are at high risk for osteoporosis. Talk with your health care provider about your test results, treatment options, and if necessary,  the need for more tests. Follow these instructions at home: Eating and drinking  Eat a diet that includes fresh fruits and vegetables, whole grains, lean protein, and low-fat dairy products.  Take vitamin and mineral supplements as recommended by your health care provider.  Do not drink alcohol if: ? Your health care provider tells you not to drink. ? You are pregnant, may be pregnant, or are planning to become pregnant.  If you  drink alcohol: ? Limit how much you have to 0-1 drink a day. ? Be aware of how much alcohol is in your drink. In the [place] one drink equals one 12 oz bottle of beer (355 mL), one 5 oz glass of wine (148 mL), or one 1 oz glass of hard liquor (44 mL).   Lifestyle  Take daily care of your teeth and gums. Brush your teeth every morning and night with fluoride toothpaste. Floss one time each day.  Stay active. Exercise for at least 30 minutes 5 or more days each week.  Do not use any products that contain nicotine or tobacco, such as cigarettes, e-cigarettes, and chewing tobacco. If you need help quitting, ask your health care provider.  Do not use drugs.  If you are sexually active, practice safe sex. Use a condom or other form of protection to prevent STIs (sexually transmitted infections).  If you do not wish to become pregnant, use a form of birth control. If you plan to become pregnant, see your health care provider for a prepregnancy visit.  If told by your health care provider, take low-dose aspirin daily starting at age 2.  Find healthy ways to cope with stress, such as: ? Meditation, yoga, or listening to music. ? Journaling. ? Talking to a trusted person. ? Spending time with friends and family. Safety  Always wear your seat belt while driving or riding in a vehicle.  Do not drive: ? If you have been drinking alcohol. Do not ride with someone who has been drinking. ? When you are tired or distracted. ? While texting.  Wear a helmet and other protective equipment during sports activities.  If you have firearms in your house, make sure you follow all gun safety procedures. What's next?  Visit your health care provider once a year for an annual wellness visit.  Ask your health care provider how often you should have your eyes and teeth checked.  Stay up to date on all vaccines. This information is not intended to replace advice given to you by your health care provider.  Make sure you discuss any questions you have with your health care provider. Document Revised: 03/09/2020 Document Reviewed: 02/14/2018 Elsevier Patient Education  2021 [employer].

## 2020-07-20 ENCOUNTER — Encounter: Payer: Self-pay | Admitting: Family Medicine

## 2021-04-01 ENCOUNTER — Ambulatory Visit (INDEPENDENT_AMBULATORY_CARE_PROVIDER_SITE_OTHER): Payer: BC Managed Care – PPO | Admitting: Family Medicine

## 2021-04-01 DIAGNOSIS — A084 Viral intestinal infection, unspecified: Secondary | ICD-10-CM

## 2021-04-01 DIAGNOSIS — R112 Nausea with vomiting, unspecified: Secondary | ICD-10-CM | POA: Diagnosis not present

## 2021-04-01 MED ORDER — ONDANSETRON 4 MG PO TBDP: 4.0000 mg | ORAL_TABLET | Freq: Three times a day (TID) | ORAL | 0 refills | Status: DC | PRN

## 2021-04-01 NOTE — Progress Notes (Signed)
   Virtual Visit via Telephone Note  I connected with Catherine Walters on 04/01/21 at 1:56 PM by telephone and verified that I am speaking with the correct person using two identifiers. Catherine Walters is currently located at home and nobody is currently with her during this visit. The provider, Gwenlyn Fudge, FNP is located in their office at time of visit.  I discussed the limitations, risks, security and privacy concerns of performing an evaluation and management service by telephone and the availability of in person appointments. I also discussed with the patient that there may be a patient responsible charge related to this service. The patient expressed understanding and agreed to proceed.  Subjective: PCP: Gwenlyn Fudge, FNP  Chief Complaint  Patient presents with   Emesis   Diarrhea   Patient reports she had an episode of vomiting at the beginning of September after eating a pork chop. She had a similar episode five days ago after eating a pork chop. Last night she starting having diarrhea and vomiting that has continued today. She has not drank very much today because when she did drink a few sips of Ginger Ale she got sick again. She is feeling weak.    ROS: Per HPI No current outpatient medications on file.  Allergies  Allergen Reactions   Entex T  [Pseudoephedrine-Guaifenesin] Hives   Shellfish Allergy Nausea And Vomiting   No past medical history on file.  Observations/Objective: A&O  No respiratory distress or wheezing audible over the phone Mood, judgement, and thought processes all WNL   Assessment and Plan: 1. Viral gastroenteritis Zofran to help with nausea and vomiting so that patient can tolerate fluids and stay hydrated. Encouraged ice chips, popsicles, ice cream, italian ice, etc. Bland foods if she wants to eat. Advised not to stop diarrhea so that infection has a way out. - ondansetron (ZOFRAN ODT) 4 MG disintegrating tablet; Take 1 tablet (4 mg total)  by mouth every 8 (eight) hours as needed for nausea or vomiting.  Dispense: 30 tablet; Refill: 0  2. Nausea and vomiting in adult Testing for alpha gal due to two previous episodes of vomiting with pork.  - Alpha-Gal Panel; Future   Follow Up Instructions:  I discussed the assessment and treatment plan with the patient. The patient was provided an opportunity to ask questions and all were answered. The patient agreed with the plan and demonstrated an understanding of the instructions.   The patient was advised to call back or seek an in-person evaluation if the symptoms worsen or if the condition fails to improve as anticipated.  The above assessment and management plan was discussed with the patient. The patient verbalized understanding of and has agreed to the management plan. Patient is aware to call the clinic if symptoms persist or worsen. Patient is aware when to return to the clinic for a follow-up visit. Patient educated on when it is appropriate to go to the emergency department.   Time call ended: 2:07 PM  I provided 11 minutes of non-face-to-face time during this encounter.  Deliah Boston, MSN, APRN, FNP-C [facility] 04/01/21

## 2021-07-19 ENCOUNTER — Other Ambulatory Visit: Payer: Self-pay

## 2021-07-19 ENCOUNTER — Encounter: Payer: Self-pay | Admitting: Family Medicine

## 2021-07-19 ENCOUNTER — Ambulatory Visit (HOSPITAL_COMMUNITY)
Admission: RE | Admit: 2021-07-19 | Discharge: 2021-07-19 | Disposition: A | Discharge status: Home / Self Care | Payer: 59 | Source: Ambulatory Visit | Attending: Family Medicine | Admitting: Family Medicine

## 2021-07-19 ENCOUNTER — Ambulatory Visit (INDEPENDENT_AMBULATORY_CARE_PROVIDER_SITE_OTHER): Payer: 59 | Admitting: Family Medicine

## 2021-07-19 VITALS — BP 160/89 | HR 98 | Temp 97.1°F | Ht 67.0 in | Wt 311.2 lb

## 2021-07-19 DIAGNOSIS — R112 Nausea with vomiting, unspecified: Secondary | ICD-10-CM | POA: Insufficient documentation

## 2021-07-19 DIAGNOSIS — R1011 Right upper quadrant pain: Secondary | ICD-10-CM | POA: Diagnosis not present

## 2021-07-19 NOTE — Progress Notes (Signed)
° °  Assessment & Plan:  1-2. RUQ abdominal pain/Nausea and vomiting in adult Patient is leaving our office and heading to [facility] for STAT imaging.  - US Abdomen Limited RUQ (LIVER/GB); Future   Follow up plan: Return if symptoms worsen or fail to improve.  Deliah Boston, MSN, APRN, FNP-C [facility]  Subjective:   Patient ID: Catherine Walters, female    DOB: June 09, 1966, 56 y.o.   MRN: [id number]  HPI: Catherine Walters is a 56 y.o. female presenting on 07/19/2021 for Vomiting (X 2 days), Abdominal Pain (X 2 days), and Back Pain (Lower back pain x 2 days)  Patient reports nausea, vomiting, abdominal pain, and back pain x2 days. The back pain occurs in her lower back and between her shoulder blades. The pain had subsided until she ate again yesterday. Her last BM was small, but yesterday. Denies fever.   ROS: Negative unless specifically indicated above in HPI.   Relevant past medical history reviewed and updated as indicated.   Allergies and medications reviewed and updated.   Current Outpatient Medications:    ondansetron (ZOFRAN ODT) 4 MG disintegrating tablet, Take 1 tablet (4 mg total) by mouth every 8 (eight) hours as needed for nausea or vomiting., Disp: 30 tablet, Rfl: 0  Allergies  Allergen Reactions   Entex T  [Pseudoephedrine-Guaifenesin] Hives   Shellfish Allergy Nausea And Vomiting    Objective:   BP (!) 160/89    Pulse 98    Temp (!) 97.1 F (36.2 C) (Temporal)    Ht 5\' 7"  (1.702 m)    Wt (!) 311 lb 3.2 oz (141.2 kg)    BMI 48.74 kg/m    Physical Exam Vitals reviewed.  Constitutional:      General: She is not in acute distress.    Appearance: Normal appearance. She is not ill-appearing, toxic-appearing or diaphoretic.  HENT:     Head: Normocephalic and atraumatic.  Eyes:     General: No scleral icterus.       Right eye: No discharge.        Left eye: No discharge.     Conjunctiva/sclera: Conjunctivae normal.  Cardiovascular:      Rate and Rhythm: Normal rate.  Pulmonary:     Effort: Pulmonary effort is normal. No respiratory distress.  Abdominal:     General: Abdomen is flat. Bowel sounds are decreased. There is no distension.     Palpations: Abdomen is soft. There is no hepatomegaly, splenomegaly or mass.     Tenderness: There is abdominal tenderness in the right upper quadrant and epigastric area. Positive signs include Murphy's sign.  Musculoskeletal:        General: Normal range of motion.     Cervical back: Normal range of motion.  Skin:    General: Skin is warm and dry.     Capillary Refill: Capillary refill takes less than 2 seconds.  Neurological:     General: No focal deficit present.     Mental Status: She is alert and oriented to person, place, and time. Mental status is at baseline.  Psychiatric:        Mood and Affect: Mood normal.        Behavior: Behavior normal.        Thought Content: Thought content normal.        Judgment: Judgment normal.

## 2021-07-20 ENCOUNTER — Telehealth: Payer: Self-pay | Admitting: Family Medicine

## 2021-07-20 DIAGNOSIS — K8020 Calculus of gallbladder without cholecystitis without obstruction: Secondary | ICD-10-CM

## 2021-07-20 DIAGNOSIS — A084 Viral intestinal infection, unspecified: Secondary | ICD-10-CM

## 2021-07-20 MED ORDER — DICLOFENAC SODIUM 75 MG PO TBEC: 75.0000 mg | DELAYED_RELEASE_TABLET | Freq: Two times a day (BID) | ORAL | 1 refills | Status: DC

## 2021-07-20 MED ORDER — ONDANSETRON 4 MG PO TBDP: 4.0000 mg | ORAL_TABLET | Freq: Three times a day (TID) | ORAL | 1 refills | Status: DC | PRN

## 2021-07-20 NOTE — Telephone Encounter (Signed)
Did you call patient?

## 2021-07-20 NOTE — Telephone Encounter (Signed)
I sent diclofenac and zofran for pain and nausea/vomiting. I have placed an urgent referral to general surgery.

## 2021-07-20 NOTE — Telephone Encounter (Signed)
I sent a message with her ultrasound results about a possible referral.

## 2021-07-20 NOTE — Telephone Encounter (Signed)
Patient states she would like a referral to general surgeon . States that she is in a lot of pain and would like to know what she can do until she sees general surgeon?

## 2021-07-20 NOTE — Telephone Encounter (Signed)
Patient aware and verbalizes understanding. 

## 2021-07-21 ENCOUNTER — Other Ambulatory Visit: Payer: Self-pay

## 2021-07-21 ENCOUNTER — Ambulatory Visit (INDEPENDENT_AMBULATORY_CARE_PROVIDER_SITE_OTHER): Payer: 59 | Admitting: Surgery

## 2021-07-21 ENCOUNTER — Encounter: Payer: Self-pay | Admitting: Surgery

## 2021-07-21 VITALS — BP 136/83 | HR 113 | Temp 97.5°F | Resp 18 | Ht 67.0 in | Wt 305.0 lb

## 2021-07-21 DIAGNOSIS — K8021 Calculus of gallbladder without cholecystitis with obstruction: Secondary | ICD-10-CM | POA: Diagnosis not present

## 2021-07-21 NOTE — Progress Notes (Signed)
[facility] History and Physical  Reason for Referral: Cholelithiasis Referring Physician: Delight Ovens, [place]  Chief Complaint   New Patient (Initial Visit)     Catherine Walters is a 56 y.o. female.  HPI: Patient presents for evaluation of cholelithiasis.  She states that she first had what she suspected was a gallbladder attack back in September, at which time she developed epigastric and right upper quadrant abdominal pain after eating.  She did have some episodes of nausea and vomiting at that time.  She had an additional episode in October with similar symptoms.  She then again had the same symptoms on the Sunday, however her pain was significantly worse this time.  She presented to her primary care doctor, who obtained an abdominal ultrasound demonstrating cholelithiasis without evidence of acute cholecystitis.  She believes that food brings on the pain, as prior to her episodes she ate pork chops and ribs.  Her pain has improved at this time, though she has not eaten much this week.  She denies nausea and vomiting currently.  Her last bowel movement was Monday.  She denies significant medical history and using any medications.  She denies use of blood thinning medications.  She denies history of abdominal surgeries.  She also denies use of alcohol, tobacco products, and illegal drugs.  No past medical history on file.  Past Surgical History:  Procedure Laterality Date   DILATION AND CURETTAGE OF UTERUS  05/2011    Family History  Problem Relation Age of Onset   Diabetes Mother    Hypertension Mother    Thyroid disease Mother    Arthritis Mother    Thyroid disease Sister    Thyroid disease Brother    Diabetes Brother    Ovarian cancer Maternal Grandmother    Diabetes Maternal Grandmother    Hypertension Maternal Grandmother    Dementia Paternal Grandfather    Mental illness Son     Social History   Tobacco Use   Smoking status: Former    Types:  Cigarettes   Smokeless tobacco: Never   Tobacco comments:    17 years ago  Vaping Use   Vaping Use: Never used  Substance Use Topics   Alcohol use: Not Currently   Drug use: Never    Medications: I have reviewed the patient's current medications. Allergies as of 07/21/2021       Reactions   Entex T  [pseudoephedrine-guaifenesin] Hives   Shellfish Allergy Nausea And Vomiting        Medication List        Accurate as of July 21, 2021  3:07 PM. If you have any questions, ask your nurse or doctor.          STOP taking these medications    diclofenac 75 MG EC tablet Commonly known as: VOLTAREN Stopped by: Zalayah Pizzuto A Greysyn Vanderberg, DO   ondansetron 4 MG disintegrating tablet Commonly known as: Zofran ODT Stopped by: Catherine Bowdish A Laberta Wilbon, DO         ROS:  Constitutional: negative for chills, fatigue, and fevers Eyes: negative for visual disturbance and pain Ears, nose, mouth, throat, and face: negative for ear drainage, sore throat, and sinus problems Respiratory: negative for cough, wheezing, and shortness of breath Cardiovascular: negative for chest pain and palpitations Gastrointestinal: positive for abdominal pain, nausea, and reflux symptoms Genitourinary:negative for dysuria, frequency, and urinary retention Integument/breast: negative for dryness and rash Hematologic/lymphatic: negative for bleeding and lymphadenopathy Musculoskeletal:negative for back pain, neck pain, and joint  pain Neurological: negative for dizziness, tremors, and numbness Endocrine: negative for temperature intolerance  Blood pressure 136/83, pulse (!) 113, temperature (!) 97.5 F (36.4 C), temperature source Other (Comment), resp. rate 18, height 5\' 7"  (1.702 m), weight (!) 305 lb (138.3 kg), SpO2 94 %. Physical Exam Vitals reviewed.  Constitutional:      Appearance: Normal appearance.  HENT:     Head: Normocephalic and atraumatic.  Eyes:     Extraocular Movements:  Extraocular movements intact.     Pupils: Pupils are equal, round, and reactive to light.  Cardiovascular:     Rate and Rhythm: Regular rhythm. Tachycardia present.  Pulmonary:     Effort: Pulmonary effort is normal.     Breath sounds: Normal breath sounds.  Abdominal:     Comments: Abdomen soft, nondistended, no progression tenderness, nontender to palpation, negative Murphy's, no rigidity, guarding, rebound tenderness  Musculoskeletal:        General: Normal range of motion.     Cervical back: Normal range of motion.  Skin:    General: Skin is warm and dry.  Neurological:     General: No focal deficit present.     Mental Status: She is alert and oriented to person, place, and time.  Psychiatric:        Mood and Affect: Mood normal.        Behavior: Behavior normal.    Results: No results found for this or any previous visit (from the past 48 hour(s)).  US Abdomen Limited RUQ (LIVER/GB)  Result Date: 07/19/2021 CLINICAL DATA:  Nausea for 4 days, right upper quadrant pain EXAM: ULTRASOUND ABDOMEN LIMITED RIGHT UPPER QUADRANT COMPARISON:  None. FINDINGS: Gallbladder: Cholelithiasis with the largest gallstone measuring 2 cm. No pericholecystic fluid or gallbladder wall thickening. Negative sonographic Murphy sign. Common bile duct: Diameter: 4.6 mm Liver: No focal lesion identified. Normal hepatic parenchymal echogenicity. Portal vein is patent on color Doppler imaging with normal direction of blood flow towards the liver. Other: None. IMPRESSION: 1. Cholelithiasis without sonographic evidence of acute cholecystitis. Electronically Signed   By: Kathreen Devoid M.D.   On: 07/19/2021 15:35     Assessment & Plan:  Catherine Walters is a 56 y.o. female who presents for evaluation of cholelithiasis  -Patient's abdominal ultrasound on 1/31 was evaluated by myself, and gallstones are noted without evidence and common bile duct is 4.6 mm -I counseled the patient about the indication, risks and  benefits of laparoscopic cholecystectomy.  She understands there is a very small chance for bleeding, infection, injury to normal structures (including common bile duct), conversion to open surgery, persistent symptoms, evolution of postcholecystectomy diarrhea, need for secondary interventions, anesthesia reaction, cardiopulmonary issues and other risks not specifically detailed here. I described the expected recovery, the plan for follow-up and the restrictions during the recovery phase.  All questions were answered. -Patient tentatively scheduled for surgery on February 8 -I did provide the patient with information on low-fat gallbladder diet to maintain until her surgery   All questions were answered to the satisfaction of the patient.   Graciella Freer, DO [facility] [address] [place],  [address] [phone] (office)

## 2021-07-21 NOTE — H&P (Signed)
[facility] History and Physical  Reason for Referral: Cholelithiasis Referring Physician: Delight Ovens, [place]  Chief Complaint   New Patient (Initial Visit)     Catherine Walters is a 56 y.o. female.  HPI: Patient presents for evaluation of cholelithiasis.  She states that she first had what she suspected was a gallbladder attack back in September, at which time she developed epigastric and right upper quadrant abdominal pain after eating.  She did have some episodes of nausea and vomiting at that time.  She had an additional episode in October with similar symptoms.  She then again had the same symptoms on the Sunday, however her pain was significantly worse this time.  She presented to her primary care doctor, who obtained an abdominal ultrasound demonstrating cholelithiasis without evidence of acute cholecystitis.  She believes that food brings on the pain, as prior to her episodes she ate pork chops and ribs.  Her pain has improved at this time, though she has not eaten much this week.  She denies nausea and vomiting currently.  Her last bowel movement was Monday.  She denies significant medical history and using any medications.  She denies use of blood thinning medications.  She denies history of abdominal surgeries.  She also denies use of alcohol, tobacco products, and illegal drugs.  No past medical history on file.  Past Surgical History:  Procedure Laterality Date   DILATION AND CURETTAGE OF UTERUS  05/2011    Family History  Problem Relation Age of Onset   Diabetes Mother    Hypertension Mother    Thyroid disease Mother    Arthritis Mother    Thyroid disease Sister    Thyroid disease Brother    Diabetes Brother    Ovarian cancer Maternal Grandmother    Diabetes Maternal Grandmother    Hypertension Maternal Grandmother    Dementia Paternal Grandfather    Mental illness Son     Social History   Tobacco Use   Smoking status: Former    Types:  Cigarettes   Smokeless tobacco: Never   Tobacco comments:    17 years ago  Vaping Use   Vaping Use: Never used  Substance Use Topics   Alcohol use: Not Currently   Drug use: Never    Medications: I have reviewed the patient's current medications. Allergies as of 07/21/2021       Reactions   Entex T  [pseudoephedrine-guaifenesin] Hives   Shellfish Allergy Nausea And Vomiting        Medication List        Accurate as of July 21, 2021  3:07 PM. If you have any questions, ask your nurse or doctor.          STOP taking these medications    diclofenac 75 MG EC tablet Commonly known as: VOLTAREN Stopped by: Domanique Luckett A Taylee Gunnells, DO   ondansetron 4 MG disintegrating tablet Commonly known as: Zofran ODT Stopped by: Manaia Samad A Shreeya Recendiz, DO         ROS:  Constitutional: negative for chills, fatigue, and fevers Eyes: negative for visual disturbance and pain Ears, nose, mouth, throat, and face: negative for ear drainage, sore throat, and sinus problems Respiratory: negative for cough, wheezing, and shortness of breath Cardiovascular: negative for chest pain and palpitations Gastrointestinal: positive for abdominal pain, nausea, and reflux symptoms Genitourinary:negative for dysuria, frequency, and urinary retention Integument/breast: negative for dryness and rash Hematologic/lymphatic: negative for bleeding and lymphadenopathy Musculoskeletal:negative for back pain, neck pain, and joint  pain Neurological: negative for dizziness, tremors, and numbness Endocrine: negative for temperature intolerance  Blood pressure 136/83, pulse (!) 113, temperature (!) 97.5 F (36.4 C), temperature source Other (Comment), resp. rate 18, height 5\' 7"  (1.702 m), weight (!) 305 lb (138.3 kg), SpO2 94 %. Physical Exam Vitals reviewed.  Constitutional:      Appearance: Normal appearance.  HENT:     Head: Normocephalic and atraumatic.  Eyes:     Extraocular Movements:  Extraocular movements intact.     Pupils: Pupils are equal, round, and reactive to light.  Cardiovascular:     Rate and Rhythm: Regular rhythm. Tachycardia present.  Pulmonary:     Effort: Pulmonary effort is normal.     Breath sounds: Normal breath sounds.  Abdominal:     Comments: Abdomen soft, nondistended, no progression tenderness, nontender to palpation, negative Murphy's, no rigidity, guarding, rebound tenderness  Musculoskeletal:        General: Normal range of motion.     Cervical back: Normal range of motion.  Skin:    General: Skin is warm and dry.  Neurological:     General: No focal deficit present.     Mental Status: She is alert and oriented to person, place, and time.  Psychiatric:        Mood and Affect: Mood normal.        Behavior: Behavior normal.    Results: No results found for this or any previous visit (from the past 48 hour(s)).  US Abdomen Limited RUQ (LIVER/GB)  Result Date: 07/19/2021 CLINICAL DATA:  Nausea for 4 days, right upper quadrant pain EXAM: ULTRASOUND ABDOMEN LIMITED RIGHT UPPER QUADRANT COMPARISON:  None. FINDINGS: Gallbladder: Cholelithiasis with the largest gallstone measuring 2 cm. No pericholecystic fluid or gallbladder wall thickening. Negative sonographic Murphy sign. Common bile duct: Diameter: 4.6 mm Liver: No focal lesion identified. Normal hepatic parenchymal echogenicity. Portal vein is patent on color Doppler imaging with normal direction of blood flow towards the liver. Other: None. IMPRESSION: 1. Cholelithiasis without sonographic evidence of acute cholecystitis. Electronically Signed   By: Kathreen Devoid M.D.   On: 07/19/2021 15:35     Assessment & Plan:  [name] is a 56 y.o. female who presents for evaluation of cholelithiasis  -Patient's abdominal ultrasound on 1/31 was evaluated by myself, and gallstones are noted without evidence and common bile duct is 4.6 mm -I counseled the patient about the indication, risks and  benefits of laparoscopic cholecystectomy.  She understands there is a very small chance for bleeding, infection, injury to normal structures (including common bile duct), conversion to open surgery, persistent symptoms, evolution of postcholecystectomy diarrhea, need for secondary interventions, anesthesia reaction, cardiopulmonary issues and other risks not specifically detailed here. I described the expected recovery, the plan for follow-up and the restrictions during the recovery phase.  All questions were answered. -Patient tentatively scheduled for surgery on February 8 -I did provide the patient with information on low-fat gallbladder diet to maintain until her surgery   All questions were answered to the satisfaction of the patient.   Graciella Freer, DO [facility] [address] [place], [place] [address] [phone] (office)

## 2021-07-22 NOTE — Patient Instructions (Signed)
Catherine Walters  07/22/2021     @PREFPERIOPPHARMACY @   Your procedure is scheduled on 07/27/21.  Report to [facility] at 1210 P.M.  Call this number if you have problems the morning of surgery:  [phone]   Remember:  Do not eat or drink after midnight.   Take these medicines the morning of surgery with A SIP OF WATER prilosec    Do not wear jewelry, make-up or nail polish.  Do not wear lotions, powders, or perfumes, or deodorant.  Do not shave 48 hours prior to surgery.  Men may shave face and neck.  Do not bring valuables to the hospital.  [facility] is not responsible for any belongings or valuables.  Contacts, dentures or bridgework may not be worn into surgery.  Leave your suitcase in the car.  After surgery it may be brought to your room.  For patients admitted to the hospital, discharge time will be determined by your treatment team.  Patients discharged the day of surgery will not be allowed to drive home.   Name and phone number of your driver:   family Special instructions:    Please read over the following fact sheets that you were given. Surgical Site Infection Prevention, Anesthesia Post-op Instructions, and Care and Recovery After Surgery      PATIENT INSTRUCTIONS POST-ANESTHESIA  IMMEDIATELY FOLLOWING SURGERY:  Do not drive or operate machinery for the first twenty four hours after surgery.  Do not make any important decisions for twenty four hours after surgery or while taking narcotic pain medications or sedatives.  If you develop intractable nausea and vomiting or a severe headache please notify your doctor immediately.  FOLLOW-UP:  Please make an appointment with your surgeon as instructed. You do not need to follow up with anesthesia unless specifically instructed to do so.  WOUND CARE INSTRUCTIONS (if applicable):  Keep a dry clean dressing on the anesthesia/puncture wound site if there is drainage.  Once the wound has quit draining you may leave it  open to air.  Generally you should leave the bandage intact for twenty four hours unless there is drainage.  If the epidural site drains for more than 36-48 hours please call the anesthesia department.  QUESTIONS?:  Please feel free to call your physician or the hospital operator if you have any questions, and they will be happy to assist you.      How to Use Chlorhexidine for Bathing Chlorhexidine gluconate (CHG) is a germ-killing (antiseptic) solution that is used to clean the skin. It can get rid of the bacteria that normally live on the skin and can keep them away for about 24 hours. To clean your skin with CHG, you may be given: A CHG solution to use in the shower or as part of a sponge bath. A prepackaged cloth that contains CHG. Cleaning your skin with CHG may help lower the risk for infection: While you are staying in the intensive care unit of the hospital. If you have a vascular access, such as a central line, to provide short-term or long-term access to your veins. If you have a catheter to drain urine from your bladder. If you are on a ventilator. A ventilator is a machine that helps you breathe by moving air in and out of your lungs. After surgery. What are the risks? Risks of using CHG include: A skin reaction. Hearing loss, if CHG gets in your ears and you have a perforated eardrum. Eye injury, if CHG  gets in your eyes and is not rinsed out. The CHG product catching fire. Make sure that you avoid smoking and flames after applying CHG to your skin. Do not use CHG: If you have a chlorhexidine allergy or have previously reacted to chlorhexidine. On babies younger than 3 months of age. How to use CHG solution Use CHG only as told by your health care provider, and follow the instructions on the label. Use the full amount of CHG as directed. Usually, this is one bottle. During a shower Follow these steps when using CHG solution during a shower (unless your health care provider  gives you different instructions): Start the shower. Use your normal soap and shampoo to wash your face and hair. Turn off the shower or move out of the shower stream. Pour the CHG onto a clean washcloth. Do not use any type of brush or rough-edged sponge. Starting at your neck, lather your body down to your toes. Make sure you follow these instructions: If you will be having surgery, pay special attention to the part of your body where you will be having surgery. Scrub this area for at least 1 minute. Do not use CHG on your head or face. If the solution gets into your ears or eyes, rinse them well with water. Avoid your genital area. Avoid any areas of skin that have broken skin, cuts, or scrapes. Scrub your back and under your arms. Make sure to wash skin folds. Let the lather sit on your skin for 1-2 minutes or as long as told by your health care provider. Thoroughly rinse your entire body in the shower. Make sure that all body creases and crevices are rinsed well. Dry off with a clean towel. Do not put any substances on your body afterward--such as powder, lotion, or perfume--unless you are told to do so by your health care provider. Only use lotions that are recommended by the manufacturer. Put on clean clothes or pajamas. If it is the night before your surgery, sleep in clean sheets.  During a sponge bath Follow these steps when using CHG solution during a sponge bath (unless your health care provider gives you different instructions): Use your normal soap and shampoo to wash your face and hair. Pour the CHG onto a clean washcloth. Starting at your neck, lather your body down to your toes. Make sure you follow these instructions: If you will be having surgery, pay special attention to the part of your body where you will be having surgery. Scrub this area for at least 1 minute. Do not use CHG on your head or face. If the solution gets into your ears or eyes, rinse them well with  water. Avoid your genital area. Avoid any areas of skin that have broken skin, cuts, or scrapes. Scrub your back and under your arms. Make sure to wash skin folds. Let the lather sit on your skin for 1-2 minutes or as long as told by your health care provider. Using a different clean, wet washcloth, thoroughly rinse your entire body. Make sure that all body creases and crevices are rinsed well. Dry off with a clean towel. Do not put any substances on your body afterward--such as powder, lotion, or perfume--unless you are told to do so by your health care provider. Only use lotions that are recommended by the manufacturer. Put on clean clothes or pajamas. If it is the night before your surgery, sleep in clean sheets. How to use CHG prepackaged cloths Only use CHG  cloths as told by your health care provider, and follow the instructions on the label. Use the CHG cloth on clean, dry skin. Do not use the CHG cloth on your head or face unless your health care provider tells you to. When washing with the CHG cloth: Avoid your genital area. Avoid any areas of skin that have broken skin, cuts, or scrapes. Before surgery Follow these steps when using a CHG cloth to clean before surgery (unless your health care provider gives you different instructions): Using the CHG cloth, vigorously scrub the part of your body where you will be having surgery. Scrub using a back-and-forth motion for 3 minutes. The area on your body should be completely wet with CHG when you are done scrubbing. Do not rinse. Discard the cloth and let the area air-dry. Do not put any substances on the area afterward, such as powder, lotion, or perfume. Put on clean clothes or pajamas. If it is the night before your surgery, sleep in clean sheets.  For general bathing Follow these steps when using CHG cloths for general bathing (unless your health care provider gives you different instructions). Use a separate CHG cloth for each area  of your body. Make sure you wash between any folds of skin and between your fingers and toes. Wash your body in the following order, switching to a new cloth after each step: The front of your neck, shoulders, and chest. Both of your arms, under your arms, and your hands. Your stomach and groin area, avoiding the genitals. Your right leg and foot. Your left leg and foot. The back of your neck, your back, and your buttocks. Do not rinse. Discard the cloth and let the area air-dry. Do not put any substances on your body afterward--such as powder, lotion, or perfume--unless you are told to do so by your health care provider. Only use lotions that are recommended by the manufacturer. Put on clean clothes or pajamas. Contact a health care provider if: Your skin gets irritated after scrubbing. You have questions about using your solution or cloth. You swallow any chlorhexidine. Call your local poison control center (1-587 577 8030 in the [place] Get help right away if: Your eyes itch badly, or they become very red or swollen. Your skin itches badly and is red or swollen. Your hearing changes. You have trouble seeing. You have swelling or tingling in your mouth or throat. You have trouble breathing. These symptoms may represent a serious problem that is an emergency. Do not wait to see if the symptoms will go away. Get medical help right away. Call your local emergency services (911 in the [place] Do not drive yourself to the hospital. Summary Chlorhexidine gluconate (CHG) is a germ-killing (antiseptic) solution that is used to clean the skin. Cleaning your skin with CHG may help to lower your risk for infection. You may be given CHG to use for bathing. It may be in a bottle or in a prepackaged cloth to use on your skin. Carefully follow your health care provider's instructions and the instructions on the product label. Do not use CHG if you have a chlorhexidine allergy. Contact your health care  provider if your skin gets irritated after scrubbing. This information is not intended to replace advice given to you by your health care provider. Make sure you discuss any questions you have with your health care provider. Document Revised: 08/16/2020 Document Reviewed: 08/16/2020 Elsevier Patient Education  2022 [place]. Minimally Invasive Cholecystectomy, Care After What can I expect  after the procedure? After the procedure, it is common to: Have pain at the areas of surgery. You will be given medicines for pain. Vomit or feel like you may vomit. Feel fullness in the belly (bloating) or have pain in the shoulder. This comes from the gas that was used during the surgery. Follow these instructions at home: Medicines Take over-the-counter and prescription medicines only as told by your doctor. If you were prescribed an antibiotic medicine, take it as told by your doctor. Do not stop taking it even if you start to feel better. If told, take steps to prevent problems with pooping (constipation). You may need to: Drink enough fluid to keep your pee (urine) pale yellow. Take medicines. You will be told what medicines to take. Eat foods that are high in fiber. These include beans, whole grains, and fresh fruits and vegetables. Limit foods that are high in fat and sugar. These include fried or sweet foods. Ask your doctor if you should avoid driving or using machines while you are taking your medicine. Incision care  Follow instructions from your doctor about how to take care of your cuts from surgery (incisions). Make sure you: Wash your hands with soap and water for at least 20 seconds before and after you change your bandage (dressing). If you cannot use soap and water, use hand sanitizer. Change your bandage. Leave stitches (sutures) or skin glue in place for at least 2 weeks. Leave tape strips alone unless you are told to take them off. You may trim the edges of the tape strips if  they curl up. Do not take baths, swim, or use a hot tub. Ask your doctor about taking showers or sponge baths. Check your incision area every day for signs of infection. Check for: More redness, swelling, or pain. Fluid or blood. Warmth. Pus or a bad smell. Activity Rest as told by your doctor. Do not do activities that require a lot of effort. Get up to take short walks every 1 to 2 hours. Ask for help if you feel weak or unsteady. Do not lift anything that is heavier than 10 lb (4.5 kg), or the limit that you are told. Do not play contact sports until your doctor says it is okay. Do not return to work or school until your doctor says it is okay. Return to your normal activities when your doctor says that it is safe. General instructions If you were given a sedative during your procedure, do not drive or use machines until your doctor says that it is safe. A sedative is a medicine that helps you relax. Keep all follow-up visits. Contact a doctor if: You get a rash. You have more redness, swelling, or pain around your incisions. You have fluid or blood coming from your incisions. Your incisions feel warm to the touch. You have pus or a bad smell coming from your incisions. You have a fever. One or more of your incisions breaks open. Get help right away if: You have trouble breathing. You have chest pain. You have pain that is getting worse in your shoulders. You faint or feel dizzy when you stand. You have very bad pain in your belly (abdomen). You feel like you may vomit or you vomit, and this lasts for more than one day. You have leg pain. These symptoms may be an emergency. Get help right away. Call 911. Do not wait to see if the symptoms will go away. Do not drive yourself to the hospital.  Summary After your surgery, it is common to have pain at the areas of surgery. You may also vomit or feel fullness in the belly. Follow your doctor's instructions about medicine, activity  restrictions, and caring for your surgery areas. Do not do activities that require a lot of effort. Contact a doctor if you have a fever or other signs of infection, such as more redness, swelling, or pain around your incisions. Get help right away if you have chest pain, increasing pain in the shoulders, or trouble breathing. This information is not intended to replace advice given to you by your health care provider. Make sure you discuss any questions you have with your health care provider. Document Revised: 12/07/2020 Document Reviewed: 12/07/2020 Elsevier Patient Education  [place].

## 2021-07-25 ENCOUNTER — Encounter (HOSPITAL_COMMUNITY): Payer: Self-pay

## 2021-07-25 ENCOUNTER — Encounter (HOSPITAL_COMMUNITY)
Admission: RE | Admit: 2021-07-25 | Discharge: 2021-07-25 | Disposition: A | Discharge status: Home / Self Care | Payer: 59 | Source: Ambulatory Visit | Attending: Surgery | Admitting: Surgery

## 2021-07-25 VITALS — BP 149/80 | HR 91 | Temp 97.3°F | Resp 18 | Ht 67.0 in | Wt 305.0 lb

## 2021-07-25 DIAGNOSIS — Z01818 Encounter for other preprocedural examination: Secondary | ICD-10-CM | POA: Insufficient documentation

## 2021-07-25 HISTORY — DX: Gastro-esophageal reflux disease without esophagitis: K21.9

## 2021-07-25 LAB — PREGNANCY, URINE: Preg Test, Ur: NEGATIVE

## 2021-07-27 ENCOUNTER — Encounter (HOSPITAL_COMMUNITY): Payer: Self-pay | Admitting: Surgery

## 2021-07-27 ENCOUNTER — Encounter (HOSPITAL_COMMUNITY)
Admission: RE | Disposition: A | Discharge status: Home / Self Care | Payer: Self-pay | Source: Home / Self Care | Attending: Surgery

## 2021-07-27 ENCOUNTER — Ambulatory Visit (HOSPITAL_COMMUNITY): Payer: 59 | Admitting: Certified Registered"

## 2021-07-27 ENCOUNTER — Ambulatory Visit (HOSPITAL_COMMUNITY)
Admission: RE | Admit: 2021-07-27 | Discharge: 2021-07-27 | Disposition: A | Discharge status: Home / Self Care | Payer: 59 | Attending: Surgery | Admitting: Surgery

## 2021-07-27 ENCOUNTER — Other Ambulatory Visit: Payer: Self-pay

## 2021-07-27 DIAGNOSIS — Z6841 Body Mass Index (BMI) 40.0 and over, adult: Secondary | ICD-10-CM | POA: Diagnosis not present

## 2021-07-27 DIAGNOSIS — K219 Gastro-esophageal reflux disease without esophagitis: Secondary | ICD-10-CM | POA: Diagnosis not present

## 2021-07-27 DIAGNOSIS — K8012 Calculus of gallbladder with acute and chronic cholecystitis without obstruction: Secondary | ICD-10-CM | POA: Diagnosis not present

## 2021-07-27 DIAGNOSIS — K8010 Calculus of gallbladder with chronic cholecystitis without obstruction: Secondary | ICD-10-CM | POA: Insufficient documentation

## 2021-07-27 DIAGNOSIS — Z87891 Personal history of nicotine dependence: Secondary | ICD-10-CM | POA: Diagnosis not present

## 2021-07-27 DIAGNOSIS — K810 Acute cholecystitis: Secondary | ICD-10-CM | POA: Diagnosis not present

## 2021-07-27 HISTORY — PX: CHOLECYSTECTOMY: SHX55

## 2021-07-27 SURGERY — LAPAROSCOPIC CHOLECYSTECTOMY
Anesthesia: General | Site: Abdomen

## 2021-07-27 MED ORDER — LACTATED RINGERS IV SOLN: INTRAVENOUS | Status: DC

## 2021-07-27 MED ORDER — DEXAMETHASONE SODIUM PHOSPHATE 10 MG/ML IJ SOLN
INTRAMUSCULAR | Status: DC | PRN
  Administered 2021-07-27: 10 mg via INTRAVENOUS

## 2021-07-27 MED ORDER — ACETAMINOPHEN 500 MG PO TABS: 1000.0000 mg | ORAL_TABLET | Freq: Four times a day (QID) | ORAL | 0 refills | Status: AC

## 2021-07-27 MED ORDER — HYDROMORPHONE HCL 1 MG/ML IJ SOLN
0.5000 mg | INTRAMUSCULAR | Status: AC
  Administered 2021-07-27 (×4): 0.5 mg via INTRAVENOUS
  Filled 2021-07-27 (×3): qty 0.5

## 2021-07-27 MED ORDER — IBUPROFEN 200 MG PO TABS: 400.0000 mg | ORAL_TABLET | Freq: Four times a day (QID) | ORAL | 0 refills | Status: AC

## 2021-07-27 MED ORDER — ONDANSETRON HCL 4 MG/2ML IJ SOLN
INTRAMUSCULAR | Status: DC | PRN
Start: 2021-07-27 — End: 2021-07-27
  Administered 2021-07-27: 4 mg via INTRAVENOUS

## 2021-07-27 MED ORDER — HEMOSTATIC AGENTS (NO CHARGE) OPTIME
TOPICAL | Status: DC | PRN
  Administered 2021-07-27 (×3): 1 via TOPICAL

## 2021-07-27 MED ORDER — FENTANYL CITRATE (PF) 250 MCG/5ML IJ SOLN
INTRAMUSCULAR | Status: DC | PRN
  Administered 2021-07-27 (×3): 50 ug via INTRAVENOUS
  Administered 2021-07-27: 100 ug via INTRAVENOUS

## 2021-07-27 MED ORDER — MIDAZOLAM HCL 2 MG/2ML IJ SOLN
INTRAMUSCULAR | Status: AC
  Filled 2021-07-27: qty 2

## 2021-07-27 MED ORDER — SODIUM CHLORIDE 0.9 % IR SOLN
Status: DC | PRN
  Administered 2021-07-27: 3000 mL

## 2021-07-27 MED ORDER — ROCURONIUM BROMIDE 10 MG/ML (PF) SYRINGE
PREFILLED_SYRINGE | INTRAVENOUS | Status: DC | PRN
Start: 2021-07-27 — End: 2021-07-27
  Administered 2021-07-27: 70 mg via INTRAVENOUS

## 2021-07-27 MED ORDER — CHLORHEXIDINE GLUCONATE CLOTH 2 % EX PADS: 6.0000 | MEDICATED_PAD | Freq: Once | CUTANEOUS | Status: DC

## 2021-07-27 MED ORDER — PROPOFOL 10 MG/ML IV BOLUS
INTRAVENOUS | Status: DC | PRN
  Administered 2021-07-27: 180 mg via INTRAVENOUS

## 2021-07-27 MED ORDER — SUGAMMADEX SODIUM 200 MG/2ML IV SOLN
INTRAVENOUS | Status: DC | PRN
Start: 2021-07-27 — End: 2021-07-27
  Administered 2021-07-27: 300 mg via INTRAVENOUS

## 2021-07-27 MED ORDER — HYDROMORPHONE HCL 1 MG/ML IJ SOLN
INTRAMUSCULAR | Status: AC
  Filled 2021-07-27: qty 0.5

## 2021-07-27 MED ORDER — MIDAZOLAM HCL 5 MG/5ML IJ SOLN
INTRAMUSCULAR | Status: DC | PRN
Start: 2021-07-27 — End: 2021-07-27
  Administered 2021-07-27: 2 mg via INTRAVENOUS

## 2021-07-27 MED ORDER — OXYCODONE HCL 5 MG PO TABS
5.0000 mg | ORAL_TABLET | Freq: Three times a day (TID) | ORAL | 0 refills | Status: DC | PRN
Start: 2021-07-27 — End: 2021-08-03

## 2021-07-27 MED ORDER — ONDANSETRON HCL 4 MG/2ML IJ SOLN
4.0000 mg | Freq: Once | INTRAMUSCULAR | Status: AC | PRN
  Administered 2021-07-27: 4 mg via INTRAVENOUS
  Filled 2021-07-27: qty 2

## 2021-07-27 MED ORDER — CIPROFLOXACIN HCL 500 MG PO TABS: 500.0000 mg | ORAL_TABLET | Freq: Two times a day (BID) | ORAL | 0 refills | Status: AC

## 2021-07-27 MED ORDER — LIDOCAINE 2% (20 MG/ML) 5 ML SYRINGE
INTRAMUSCULAR | Status: DC | PRN
  Administered 2021-07-27: 100 mg via INTRAVENOUS

## 2021-07-27 MED ORDER — PROPOFOL 10 MG/ML IV BOLUS
INTRAVENOUS | Status: AC
  Filled 2021-07-27: qty 20

## 2021-07-27 MED ORDER — FENTANYL CITRATE (PF) 250 MCG/5ML IJ SOLN
INTRAMUSCULAR | Status: AC
  Filled 2021-07-27: qty 5

## 2021-07-27 MED ORDER — SODIUM CHLORIDE 0.9 % IV SOLN
2.0000 g | INTRAVENOUS | Status: AC
  Administered 2021-07-27: 2 g via INTRAVENOUS

## 2021-07-27 MED ORDER — BUPIVACAINE HCL (PF) 0.5 % IJ SOLN
INTRAMUSCULAR | Status: AC
  Filled 2021-07-27: qty 30

## 2021-07-27 MED ORDER — FENTANYL CITRATE PF 50 MCG/ML IJ SOSY
25.0000 ug | PREFILLED_SYRINGE | INTRAMUSCULAR | Status: DC | PRN
  Administered 2021-07-27 (×3): 50 ug via INTRAVENOUS
  Filled 2021-07-27 (×3): qty 1

## 2021-07-27 MED ORDER — SODIUM CHLORIDE 0.9 % IV SOLN
INTRAVENOUS | Status: AC
  Filled 2021-07-27: qty 2

## 2021-07-27 MED ORDER — DOCUSATE SODIUM 100 MG PO CAPS: 100.0000 mg | ORAL_CAPSULE | Freq: Two times a day (BID) | ORAL | 0 refills | Status: AC

## 2021-07-27 MED ORDER — LIDOCAINE HCL (PF) 2 % IJ SOLN
INTRAMUSCULAR | Status: AC
  Filled 2021-07-27: qty 10

## 2021-07-27 MED ORDER — METRONIDAZOLE 500 MG PO TABS: 500.0000 mg | ORAL_TABLET | Freq: Three times a day (TID) | ORAL | 0 refills | Status: AC

## 2021-07-27 MED ORDER — 0.9 % SODIUM CHLORIDE (POUR BTL) OPTIME
TOPICAL | Status: DC | PRN
  Administered 2021-07-27: 1000 mL

## 2021-07-27 MED ORDER — ROCURONIUM BROMIDE 10 MG/ML (PF) SYRINGE
PREFILLED_SYRINGE | INTRAVENOUS | Status: AC
  Filled 2021-07-27: qty 10

## 2021-07-27 MED ORDER — BUPIVACAINE HCL (PF) 0.5 % IJ SOLN
INTRAMUSCULAR | Status: DC | PRN
  Administered 2021-07-27: 10 mL

## 2021-07-27 MED ORDER — MIDAZOLAM HCL 2 MG/2ML IJ SOLN
1.0000 mg | INTRAMUSCULAR | Status: DC | PRN
  Administered 2021-07-27: 1 mg via INTRAVENOUS

## 2021-07-27 SURGICAL SUPPLY — 45 items
ADH SKN CLS APL DERMABOND .7 (GAUZE/BANDAGES/DRESSINGS) ×1
APL PRP STRL LF DISP 70% ISPRP (MISCELLANEOUS) ×1
APL SRG 38 LTWT LNG FL B (MISCELLANEOUS) ×1
APPLICATOR ARISTA FLEXITIP XL (MISCELLANEOUS) ×1 IMPLANT
APPLIER CLIP ROT 10 11.4 M/L (STAPLE) ×2
APR CLP MED LRG 11.4X10 (STAPLE) ×1
BAG RETRIEVAL 10 (BASKET) ×1
BLADE SURG 15 STRL LF DISP TIS (BLADE) ×1 IMPLANT
BLADE SURG 15 STRL SS (BLADE) ×2
CHLORAPREP W/TINT 26 (MISCELLANEOUS) ×2 IMPLANT
CLIP APPLIE ROT 10 11.4 M/L (STAPLE) ×1 IMPLANT
CLOTH BEACON ORANGE TIMEOUT ST (SAFETY) ×2 IMPLANT
COVER LIGHT HANDLE STERIS (MISCELLANEOUS) ×4 IMPLANT
DERMABOND ADVANCED (GAUZE/BANDAGES/DRESSINGS) ×1
DERMABOND ADVANCED .7 DNX12 (GAUZE/BANDAGES/DRESSINGS) ×1 IMPLANT
ELECT REM PT RETURN 9FT ADLT (ELECTROSURGICAL) ×2
ELECTRODE REM PT RTRN 9FT ADLT (ELECTROSURGICAL) ×1 IMPLANT
GAUZE 4X4 16PLY ~~LOC~~+RFID DBL (SPONGE) ×2 IMPLANT
GLOVE SURG ENC MOIS LTX SZ6.5 (GLOVE) ×2 IMPLANT
GLOVE SURG UNDER POLY LF SZ7 (GLOVE) ×8 IMPLANT
GOWN STRL REUS W/TWL LRG LVL3 (GOWN DISPOSABLE) ×6 IMPLANT
HEMOSTAT ARISTA ABSORB 1G (HEMOSTASIS) ×1 IMPLANT
HEMOSTAT SNOW SURGICEL 2X4 (HEMOSTASIS) ×3 IMPLANT
INST SET LAPROSCOPIC AP (KITS) ×2 IMPLANT
IV NS IRRIG 3000ML ARTHROMATIC (IV SOLUTION) ×1 IMPLANT
KIT TURNOVER KIT A (KITS) ×2 IMPLANT
MANIFOLD NEPTUNE II (INSTRUMENTS) ×2 IMPLANT
NDL INSUFFLATION 14GA 120MM (NEEDLE) ×1 IMPLANT
NEEDLE INSUFFLATION 14GA 120MM (NEEDLE) ×2 IMPLANT
NS IRRIG 1000ML POUR BTL (IV SOLUTION) ×2 IMPLANT
PACK LAP CHOLE LZT030E (CUSTOM PROCEDURE TRAY) ×2 IMPLANT
PAD ARMBOARD 7.5X6 YLW CONV (MISCELLANEOUS) ×2 IMPLANT
SET BASIN LINEN APH (SET/KITS/TRAYS/PACK) ×2 IMPLANT
SET TUBE IRRIG SUCTION NO TIP (IRRIGATION / IRRIGATOR) ×1 IMPLANT
SET TUBE SMOKE EVAC HIGH FLOW (TUBING) ×2 IMPLANT
SLEEVE ENDOPATH XCEL 5M (ENDOMECHANICALS) ×2 IMPLANT
SUT MNCRL AB 4-0 PS2 18 (SUTURE) ×4 IMPLANT
SUT VICRYL 0 UR6 27IN ABS (SUTURE) ×2 IMPLANT
SYS BAG RETRIEVAL 10MM (BASKET) ×1
SYSTEM BAG RETRIEVAL 10MM (BASKET) ×1 IMPLANT
TROCAR ENDO BLADELESS 11MM (ENDOMECHANICALS) ×2 IMPLANT
TROCAR XCEL NON-BLD 5MMX100MML (ENDOMECHANICALS) ×2 IMPLANT
TROCAR XCEL UNIV SLVE 11M 100M (ENDOMECHANICALS) ×2 IMPLANT
TUBE CONNECTING 12X1/4 (SUCTIONS) ×2 IMPLANT
WARMER LAPAROSCOPE (MISCELLANEOUS) ×2 IMPLANT

## 2021-07-27 NOTE — Op Note (Addendum)
Operative Note   Preoperative Diagnosis: Symptomatic cholelithiasis   Postoperative Diagnosis: Cholelithiasis, Acute Cholecystitis with Empyema of the gallbladder   Procedure(s) Performed: Laparoscopic cholecystectomy   Surgeon: Graciella Freer, DO    Assistants: Aviva Signs, MD   Anesthesia: General endotracheal   Anesthesiologist: No responsible provider has been recorded for the case.    Specimens: Gallbladder    Estimated Blood Loss: 100 cc   Blood Replacement: None    Complications: None    Operative Findings: Acute cholecystitis with empyema of the gallbladder  Indications: Patient is a 56 year old female who presents for laparoscopic cholecystectomy.  She has had 3 gallbladder attacks in the past, and the most recent one required her to see her PCP.  She underwent abdominal ultrasound which demonstrated cholelithiasis without cholecystitis.    All risks, benefits, and alternatives to laparoscopic cholecystectomy were discussed with the patient, all of her questions were answered to her expressed satisfaction. The patient expresses she wishes to proceed, and informed consent was obtained.   Procedure: The patient was taken to the operating room and placed supine. General endotracheal anesthesia was induced. Intravenous antibiotics were  administered per protocol. An orogastric tube positioned to decompress the stomach. The abdomen was prepared and draped in the usual sterile fashion.    A supraumbilical incision was made and a Veress technique was utilized to achieve pneumoperitoneum to 15 mmHg with carbon dioxide. A 11 mm optiview port was placed through the supraumbilical region, and a 10 mm 0-degree operative laparoscope was introduced. The area underlying the trocar and Veress needle were inspected and without evidence of injury.  Remaining trocars were placed under direct vision. Two 5 mm ports were placed in the right abdomen, between the anterior axillary and  midclavicular line.  A final 11 mm port was placed through the mid-epigastrium, near the falciform ligament.    The gallbladder was noted to be acutely inflamed with significant distention.  The gallbladder was decompressed with purulent output noted.  The gallbladder fundus was elevated cephalad and the infundibulum was retracted to the patient's right. The gallbladder/cystic duct junction was skeletonized. The cystic artery noted in the triangle of Calot and was also skeletonized.  We then continued liberal medial and lateral dissection until the critical view of safety was achieved.    The cystic duct and cystic artery were doubly clipped and divided. The gallbladder was then dissected from the liver bed with electrocautery. The specimen was placed in an Endopouch and was retrieved through the epigastric site.   Final inspection revealed acceptable hemostasis. Surgical SNOW and Arista was placed in the gallbladder bed.  Trocars were removed and pneumoperitoneum was released.  0 Vicryl fascial sutures were used to close the epigastric and umbilical port sites. Skin incisions were closed with 4-0 Monocryl subcuticular sutures and Dermabond. The patient was awakened from anesthesia and extubated without complication.  Dr. Arnoldo Morale was present in the case to assist secondary to the complexity.   Graciella Freer, DO  [facility] [address] [place], [place] [address] [phone] (office)

## 2021-07-27 NOTE — Anesthesia Preprocedure Evaluation (Signed)
Anesthesia Evaluation  Patient identified by MRN, date of birth, ID band Patient awake    Reviewed: Allergy & Precautions, H&P , NPO status , Patient's Chart, lab work & pertinent test results, reviewed documented beta blocker date and time   Airway Mallampati: II  TM Distance: >3 FB Neck ROM: full    Dental no notable dental hx.    Pulmonary neg pulmonary ROS, former smoker,    Pulmonary exam normal breath sounds clear to auscultation       Cardiovascular Exercise Tolerance: Good negative cardio ROS   Rhythm:regular Rate:Normal     Neuro/Psych negative neurological ROS  negative psych ROS   GI/Hepatic Neg liver ROS, GERD  Medicated,  Endo/Other  Morbid obesity  Renal/GU negative Renal ROS  negative genitourinary   Musculoskeletal   Abdominal   Peds  Hematology negative hematology ROS (+)   Anesthesia Other Findings   Reproductive/Obstetrics negative OB ROS                             Anesthesia Physical Anesthesia Plan  ASA: 3  Anesthesia Plan: General and General ETT   Post-op Pain Management:    Induction:   PONV Risk Score and Plan: Ondansetron  Airway Management Planned:   Additional Equipment:   Intra-op Plan:   Post-operative Plan:   Informed Consent: I have reviewed the patients History and Physical, chart, labs and discussed the procedure including the risks, benefits and alternatives for the proposed anesthesia with the patient or authorized representative who has indicated his/her understanding and acceptance.     Dental Advisory Given  Plan Discussed with: CRNA  Anesthesia Plan Comments:         Anesthesia Quick Evaluation

## 2021-07-27 NOTE — Discharge Instructions (Signed)
Ambulatory Surgery Discharge Instructions  General Anesthesia or Sedation Do not drive or operate heavy machinery for 24 hours.  Do not consume alcohol, tranquilizers, sleeping medications, or any non-prescribed medications for 24 hours. Do not make important decisions or sign any important papers in the next 24 hours. You should have someone with you tonight at home.  Activity  You are advised to go directly home from the hospital.  Restrict your activities and rest for a day.  Resume light activity tomorrow. No heavy lifting over 10 lbs or strenuous exercise.  Fluids and Diet Begin with clear liquids, bouillon, dry toast, soda crackers.  If not nauseated, you may go to a regular diet when you desire.  Greasy and spicy foods are not advised.  Medications  If you have not had a bowel movement in 24 hours, take 2 tablespoons over the counter Milk of mag.             You May resume your blood thinners tomorrow (Aspirin, coumadin, or other).  You are being discharged with prescriptions for Opioid/Narcotic Medications: There are some specific considerations for these medications that you should know. Opioid Meds have risks & benefits. Addiction to these meds is always a concern with prolonged use Take medication only as directed Do not drive while taking narcotic pain medication Do not crush tablets or capsules Do not use a different container than medication was dispensed in Lock the container of medication in a cool, dry place out of reach of children and pets. Opioid medication can cause addiction Do not share with anyone else (this is a felony) Do not store medications for future use. Dispose of them properly.     Disposal:  Find a [facility] household drug take back site near you.  If you can't get to a drug take back site, use the recipe below as a last resort to dispose of expired, unused or unwanted drugs. Disposal  (Do not dispose chemotherapy drugs this way, talk to your  prescribing doctor instead.) Step 1: Mix drugs (do not crush) with dirt, kitty litter, or used coffee grounds and add a small amount of water to dissolve any solid medications. Step 2: Seal drugs in plastic bag. Step 3: Place plastic bag in trash. Step 4: Take prescription container and scratch out personal information, then recycle or throw away.  Operative Site  You have a liquid bandage over your incisions, this will begin to flake off in about a week. Ok to shower tomorrow. Keep wound clean and dry. No baths or swimming. No lifting more than 10 pounds.  Contact Information: If you have questions or concerns, please call our office, [phone], Monday- Thursday 8AM-5PM and Friday 8AM-12Noon.  If it is after hours or on the weekend, please call [facility] Main Number, [phone], and ask to speak to the surgeon on call for Dr. Lynanne Delgreco at [facility].   SPECIFIC COMPLICATIONS TO WATCH FOR: Inability to urinate Fever over 101? F by mouth Nausea and vomiting lasting longer than 24 hours. Pain not relieved by medication ordered Swelling around the operative site Increased redness, warmth, hardness, around operative area Numbness, tingling, or cold fingers or toes Blood -soaked dressing, (small amounts of oozing may be normal) Increasing and progressive drainage from surgical area or exam site  

## 2021-07-27 NOTE — Transfer of Care (Signed)
Immediate Anesthesia Transfer of Care Note  Patient: Catherine Walters  Procedure(s) Performed: LAPAROSCOPIC CHOLECYSTECTOMY (Abdomen)  Patient Location: PACU  Anesthesia Type:General  Level of Consciousness: awake, alert , oriented and patient cooperative  Airway & Oxygen Therapy: Patient Spontanous Breathing and Patient connected to nasal cannula oxygen  Post-op Assessment: Report given to RN, Post -op Vital signs reviewed and stable and Patient moving all extremities  Post vital signs: Reviewed and stable  Last Vitals:  Vitals Value Taken Time  BP 178/74 07/27/21 1049  Temp 36.6 C 07/27/21 1049  Pulse 101 07/27/21 1054  Resp 21 07/27/21 1054  SpO2 100 % 07/27/21 1054  Vitals shown include unvalidated device data.  Last Pain:  Vitals:   07/27/21 1049  TempSrc:   PainSc: 4          Complications: No notable events documented.

## 2021-07-27 NOTE — Progress Notes (Signed)
Update Note:  I spoke with the patient's mother regarding her case.  It was explained that she had significant inflammation of her gallbladder, but it was able to be removed.  It was further explained that we will see how her pain is postoperatively, as I am hopeful that she will be able to go home.  She can eat a heart healthy diet at discharge. I will discharge her home with pain medications, and 5 days of ciprofloxacin and Flagyl.  I will have her follow up with me next week.  Theophilus Kinds, DO [facility] [address] [place], [place] [address] [phone] (office)

## 2021-07-27 NOTE — Interval H&P Note (Signed)
History and Physical Interval Note:  07/27/2021 8:48 AM  Catherine Walters  has presented today for surgery, with the diagnosis of Cholelithiasis.  The various methods of treatment have been discussed with the patient and family. After consideration of risks, benefits and other options for treatment, the patient has consented to  Procedure(s) with comments: LAPAROSCOPIC CHOLECYSTECTOMY (N/A) - LM for pt to arrive at 8am as a surgical intervention.  The patient's history has been reviewed, patient examined, no change in status, stable for surgery.  I have reviewed the patient's chart and labs.  Questions were answered to the patient's satisfaction.     Jemiah Cuadra A Ramiya Delahunty

## 2021-07-27 NOTE — Anesthesia Procedure Notes (Signed)
Procedure Name: Intubation Date/Time: 07/27/2021 9:21 AM Performed by: Myna Bright, CRNA Pre-anesthesia Checklist: Patient identified, Emergency Drugs available, Suction available and Patient being monitored Patient Re-evaluated:Patient Re-evaluated prior to induction Oxygen Delivery Method: Circle system utilized Preoxygenation: Pre-oxygenation with 100% oxygen Induction Type: IV induction Ventilation: Mask ventilation without difficulty and Oral airway inserted - appropriate to patient size Laryngoscope Size: Mac and 3 Grade View: Grade II Tube type: Oral Tube size: 7.0 mm Number of attempts: 1 Airway Equipment and Method: Stylet Placement Confirmation: ETT inserted through vocal cords under direct vision, positive ETCO2 and breath sounds checked- equal and bilateral Secured at: 21 cm Tube secured with: Tape Dental Injury: Teeth and Oropharynx as per pre-operative assessment

## 2021-07-28 ENCOUNTER — Encounter (HOSPITAL_COMMUNITY): Payer: Self-pay | Admitting: Surgery

## 2021-07-28 LAB — SURGICAL PATHOLOGY

## 2021-07-28 NOTE — Anesthesia Postprocedure Evaluation (Signed)
Anesthesia Post Note  Patient: Catherine Walters  Procedure(s) Performed: LAPAROSCOPIC CHOLECYSTECTOMY (Abdomen)  Patient location during evaluation: Phase II Anesthesia Type: General Level of consciousness: awake Pain management: pain level controlled Vital Signs Assessment: post-procedure vital signs reviewed and stable Respiratory status: spontaneous breathing and respiratory function stable Cardiovascular status: blood pressure returned to baseline and stable Postop Assessment: no headache and no apparent nausea or vomiting Anesthetic complications: no Comments: Late entry   No notable events documented.   Last Vitals:  Vitals:   07/27/21 1200 07/27/21 1209  BP: (!) 182/82 (!) 155/84  Pulse: 99 99  Resp: 14 16  Temp:  36.6 C  SpO2: 98% 95%    Last Pain:  Vitals:   07/27/21 1209  TempSrc: Oral  PainSc: 1                  Windell Norfolk

## 2021-08-03 ENCOUNTER — Other Ambulatory Visit: Payer: Self-pay

## 2021-08-03 ENCOUNTER — Encounter: Payer: Self-pay | Admitting: Surgery

## 2021-08-03 ENCOUNTER — Encounter: Payer: Self-pay | Admitting: *Deleted

## 2021-08-03 ENCOUNTER — Ambulatory Visit (INDEPENDENT_AMBULATORY_CARE_PROVIDER_SITE_OTHER): Payer: 59 | Admitting: Surgery

## 2021-08-03 VITALS — BP 161/94 | HR 90 | Temp 97.8°F | Resp 14 | Ht 67.0 in | Wt 309.0 lb

## 2021-08-03 DIAGNOSIS — Z09 Encounter for follow-up examination after completed treatment for conditions other than malignant neoplasm: Secondary | ICD-10-CM

## 2021-08-03 NOTE — Patient Instructions (Signed)
Call as needed 

## 2021-08-04 NOTE — Progress Notes (Signed)
[facility] Note   HPI:  56 y.o. Female presents to clinic for post-op follow-up evaluation status post laparoscopic cholecystectomy on 2/8.  She reports that she has been doing significantly better since surgery, and denies any further gallbladder pain.  She completed her course of antibiotics, and has not required any medications for pain in the last several days.  Her only complaints are that her urine is very light in color, even after drinking Pepsi, and that she has low pelvic pain in the middle the night when she wakes up needing to use the restroom.  The pelvic pain resolves after urinating.  She denies fever, chills, nausea, vomiting, and she is moving her bowels without issue.  Review of Systems:  All other review of systems: otherwise negative   Vital Signs:  BP (!) 161/94    Pulse 90    Temp 97.8 F (36.6 C) (Other (Comment))    Resp 14    Ht 5\' 7"  (1.702 m)    Wt (!) 309 lb (140.2 kg)    SpO2 97%    BMI 48.40 kg/m    Physical Exam:  Physical Exam Vitals reviewed.  Constitutional:      Appearance: Normal appearance.  Abdominal:     Comments: Abdomen soft, nondistended, no tenderness to palpation or percussion, no rigidity, guarding, rebound tenderness; incisions C/D/I with overlying skin glue in place  Neurological:     Mental Status: She is alert.   Laboratory studies: None  Pathology: FINAL MICROSCOPIC DIAGNOSIS:   A. GALLBLADDER, CHOLECYSTECTOMY:  Marked acute, subacute and chronic cholecystitis with mucosal ulceration  Cholelithiasis  Chronic and subacute fibrinous serositis  (See comment)   COMMENT:   Also adherent to the gallbladder is reactive hepatic parenchyma.  There  is no evidence of malignancy.   Assessment:  56 y.o. yo Female who presents for follow-up status post laparoscopic cholecystectomy on 2/8  Plan:  -We discussed patient's above pathology results -Patient doing well since surgery with complete resolution of her abdominal  pain -We further discussed that her light urine is likely secondary to staying adequately hydrated, and that her abdominal pain is most likely related to bladder distention and need for urination.  If this pain continues to persist beyond the next 2 weeks, recommend evaluation by her gynecologist, as this area would not of been affected during her surgery -Follow up as needed  All of the above recommendations were discussed with the patient, and all of patient's questions were answered to her expressed satisfaction.  4/8, DO [facility] [address] [place], [place] [phone] (office)

## 2023-08-15 ENCOUNTER — Ambulatory Visit: Payer: 59 | Admitting: Nurse Practitioner

## 2023-09-20 IMAGING — US US ABDOMEN LIMITED
1 series · 14 of 25 positions shown · non-contrast
Comparison: None.

CLINICAL DATA: Nausea for 4 days, right upper quadrant pain

EXAM:
ULTRASOUND ABDOMEN LIMITED RIGHT UPPER QUADRANT

[Series 1: us abdomen limited · 0.25mm/px · 14 of 54 slices shown]
[im 1/54]
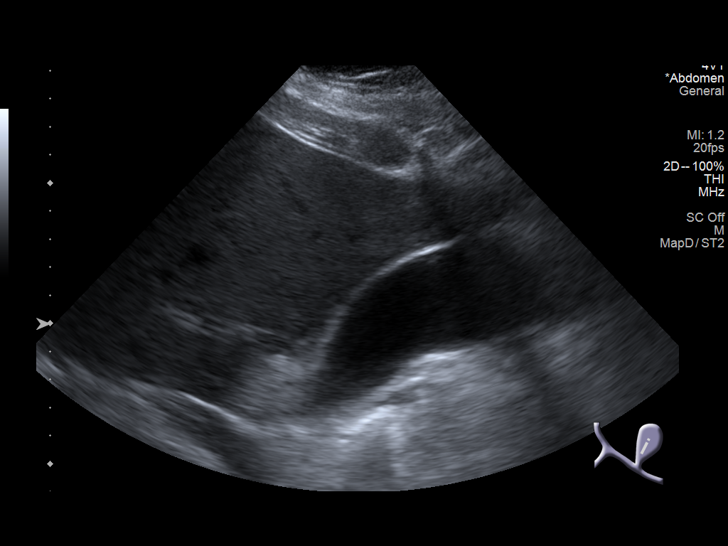
[im 5/54]
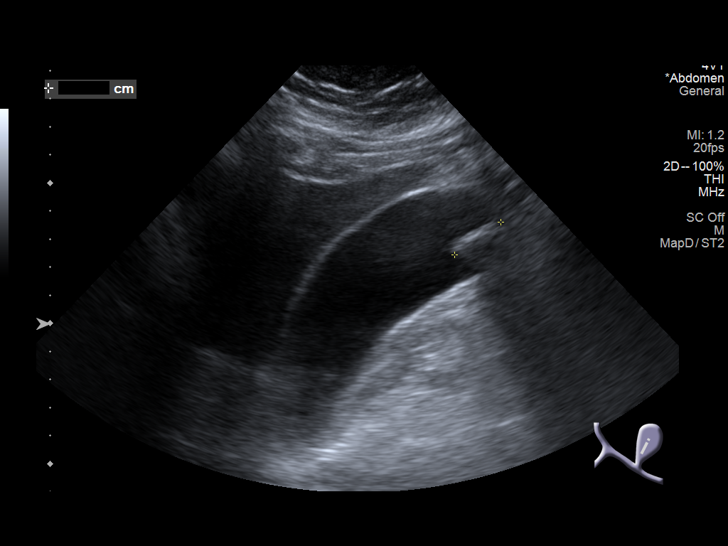
[im 9/54]
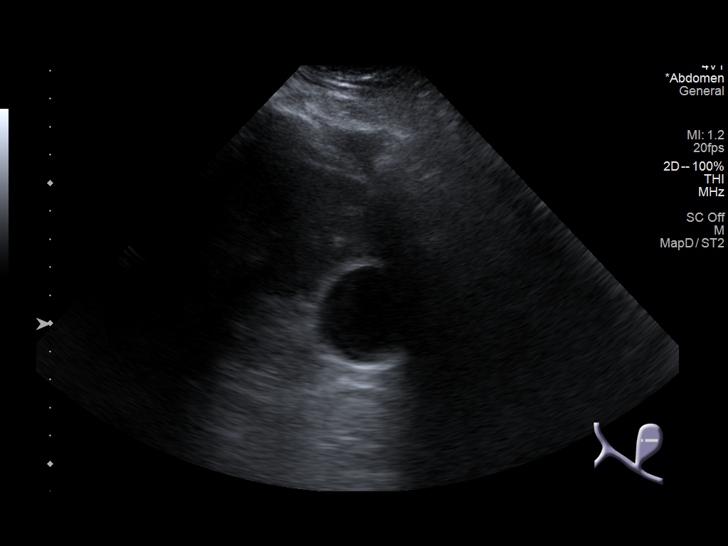
[im 14/54]
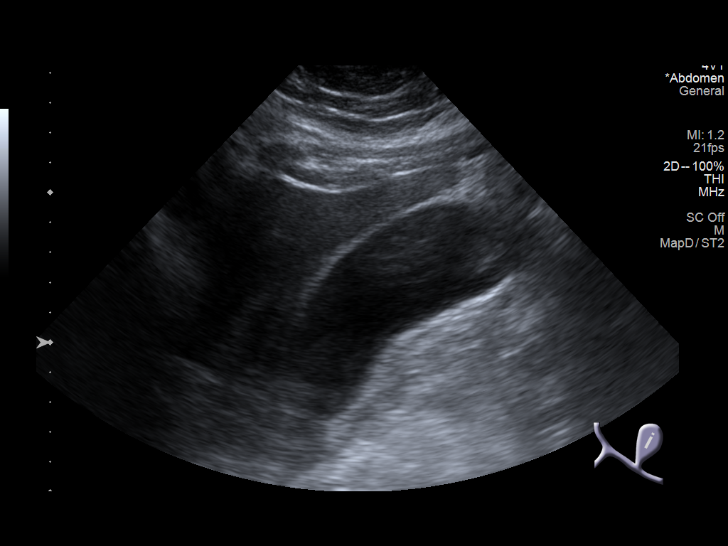
[im 18/54]
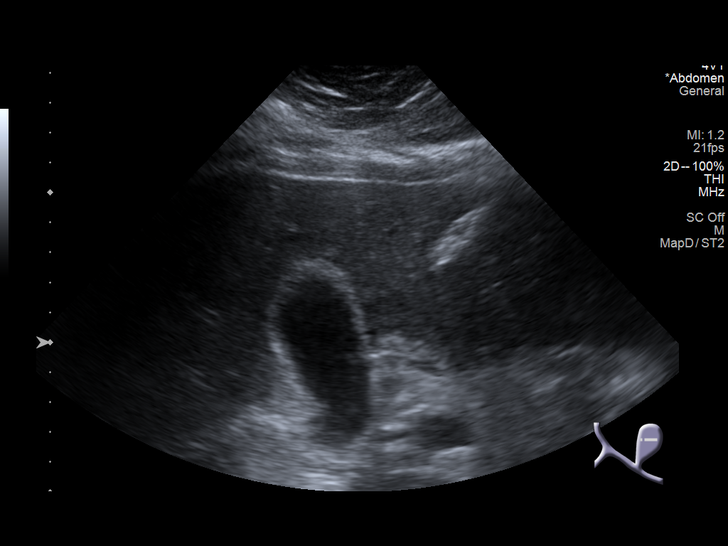
[im 20/54]
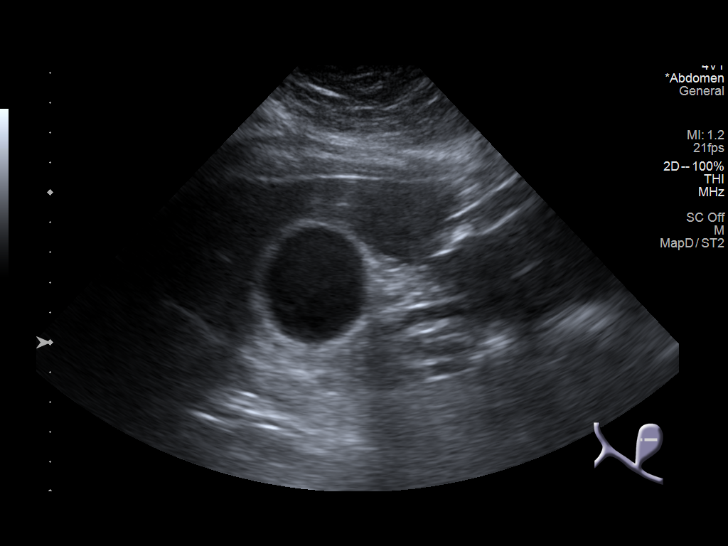
[im 25/54]
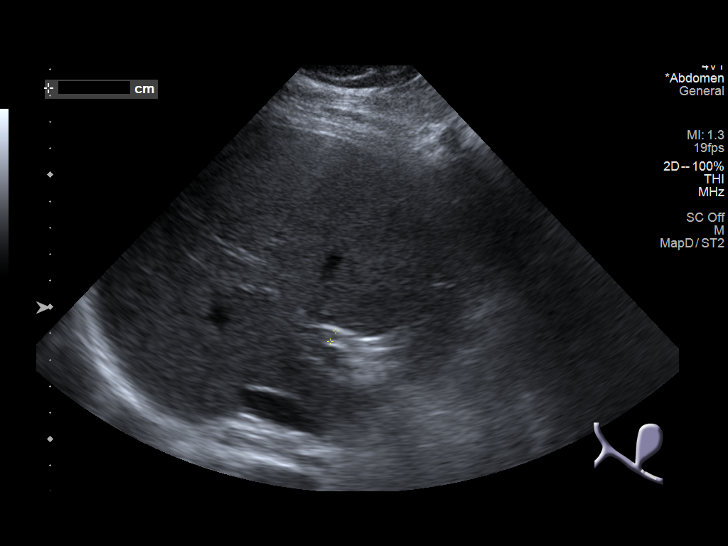
[im 29/54]
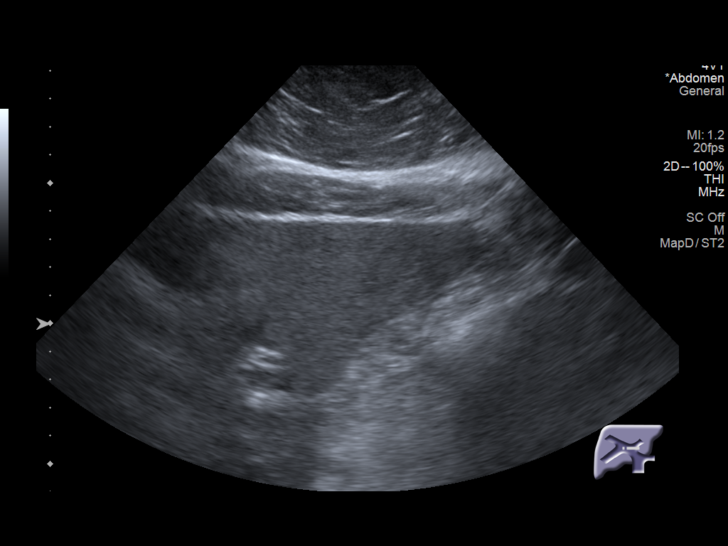
[im 34/54]
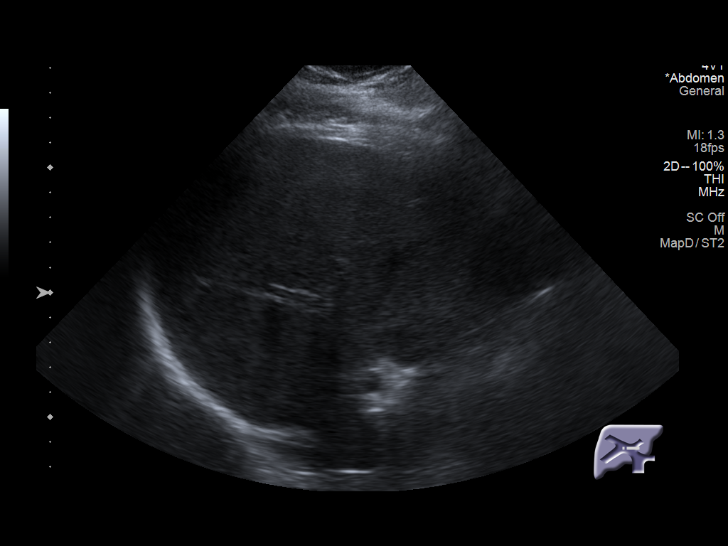
[im 36/54]
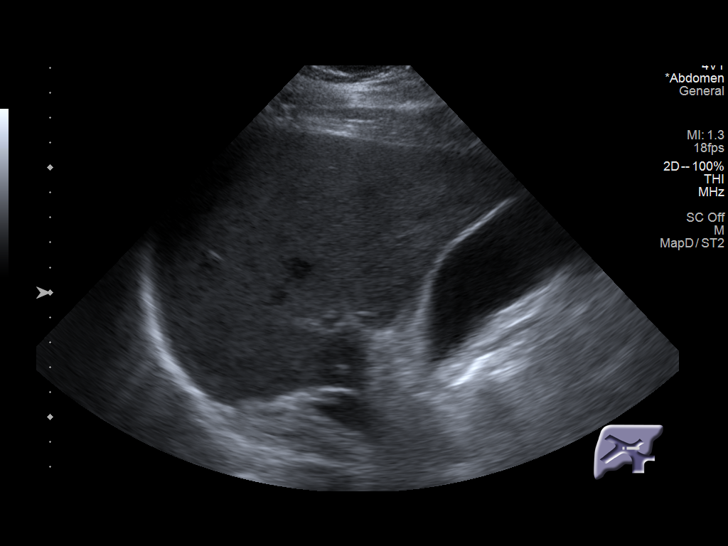
[im 40/54]
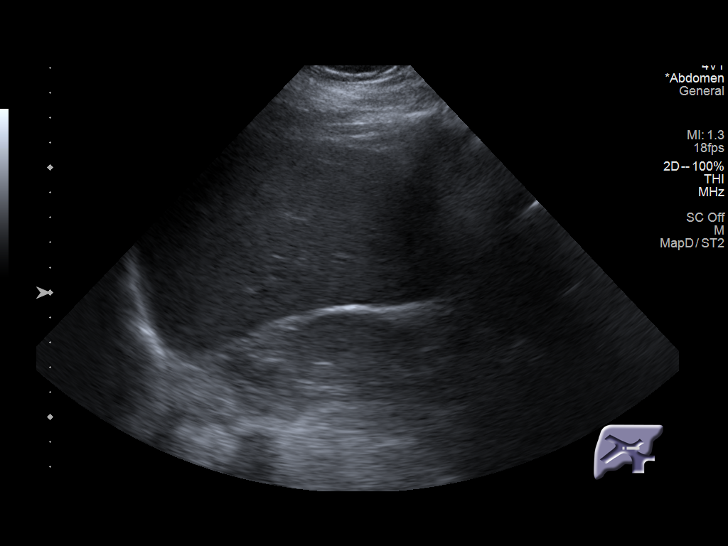
[im 45/54]
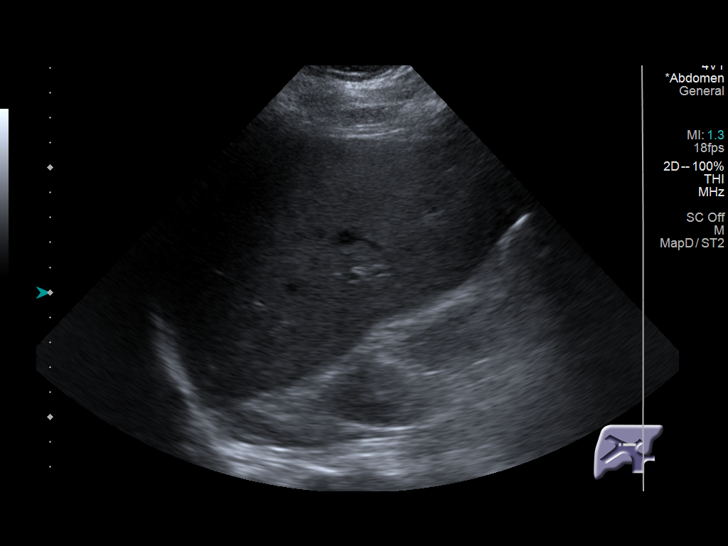
[im 49/54]
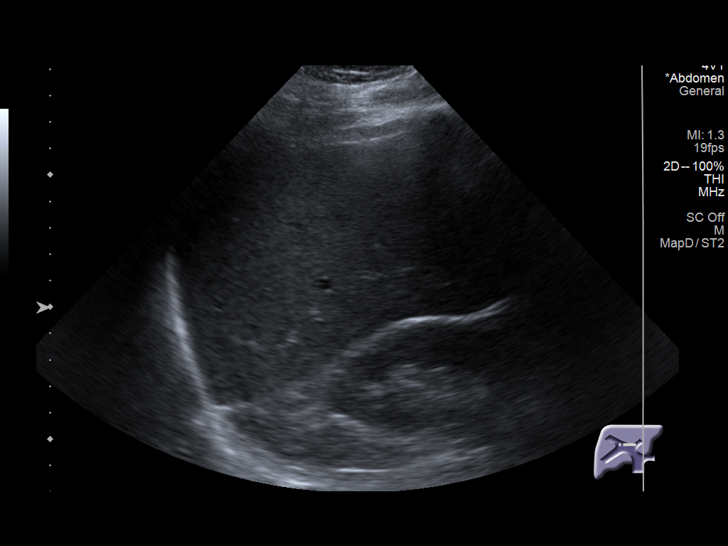
[im 54/54]
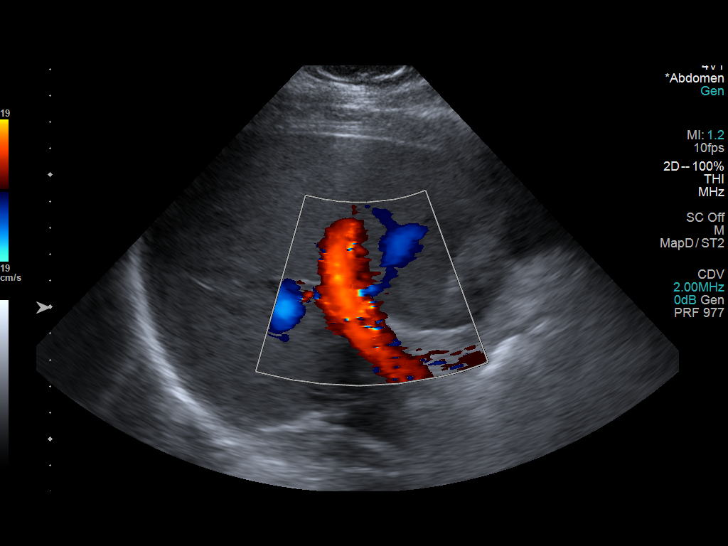

[14 of 25 positions shown; findings below may reference images not displayed]

FINDINGS: Gallbladder:

Cholelithiasis with the largest gallstone measuring 2 cm. No
pericholecystic fluid or gallbladder wall thickening. Negative
sonographic Murphy sign.

Common bile duct:

Diameter: 4.6 mm

Liver:

No focal lesion identified. Normal hepatic parenchymal echogenicity.
Portal vein is patent on color Doppler imaging with normal direction
of blood flow towards the liver.

Other: None.
IMPRESSION: 1. Cholelithiasis without sonographic evidence of acute
cholecystitis.

## 2023-10-13 LAB — AMB RESULTS CONSOLE CBG: Glucose: 135

## 2023-10-16 NOTE — Progress Notes (Signed)
 Pt came to mobile screening at [facility] in [place]. Blood pressure elevated. Discussed with pt lowering BP and seeing provider regularly. SDOH not accessed.

## 2024-01-09 NOTE — Progress Notes (Signed)
 The patient attended a screening event on 10/13/2023, where her blood pressure was measured at 160/100 mmHg, and her non-fasting blood glucose was 135 mg/dl. During the event, the patient reported that she does not smoke, has Private/VA insurance, is established with a primary care provider [facility]), and did not complete the SDOH screener.  A chart review confirmed that the patient does not have an active PCP and insurance indicated. Chart review shows that pt had a cancelled appt for 08/15/2023 with Nena Cassis. Morton Hummer - [facility]. Chart review further shows that the pt has an upcoming appt with Annabella Search, FNP - [facility] on 01/11/2024 at 8:30 AM. Chart review further shows [employer] as the patient's insurance. Pt does not have any SDOHs indicated at this time.   As of 01/11/2024, pt has a PCP, Annabella Search, FNP - [facility]. The pt has an appt scheduled for 01/16/24 at 11:15 AM with her PCP. PCP was able to address bp at the visit, PCP will f/u at next visit for bp recheck, and pt was recommended bp monitoring at home. [employer]W sent letter with bp education resources and [employer] class as a Research officer, political party.   At this time, no additional support from the Health Equity Team is indicated./An additional follow up will be done at a later date per the Health Equity Teams protocol.

## 2024-01-11 ENCOUNTER — Ambulatory Visit: Payer: Self-pay | Admitting: Family Medicine

## 2024-01-11 ENCOUNTER — Encounter: Payer: Self-pay | Admitting: Family Medicine

## 2024-01-11 VITALS — BP 153/85 | HR 102 | Temp 97.6°F | Ht 67.0 in | Wt 328.0 lb

## 2024-01-11 DIAGNOSIS — R030 Elevated blood-pressure reading, without diagnosis of hypertension: Secondary | ICD-10-CM

## 2024-01-11 DIAGNOSIS — M79605 Pain in left leg: Secondary | ICD-10-CM

## 2024-01-11 DIAGNOSIS — Z13220 Encounter for screening for lipoid disorders: Secondary | ICD-10-CM

## 2024-01-11 DIAGNOSIS — R202 Paresthesia of skin: Secondary | ICD-10-CM

## 2024-01-11 DIAGNOSIS — M79604 Pain in right leg: Secondary | ICD-10-CM | POA: Diagnosis not present

## 2024-01-11 LAB — BAYER DCA HB A1C WAIVED: HB A1C (BAYER DCA - WAIVED): 6.8 % — ABNORMAL HIGH (ref 4.8–5.6)

## 2024-01-11 NOTE — Progress Notes (Signed)
 Acute Office Visit  Subjective:     Patient ID: Catherine Walters, female    DOB: 13-Nov-1965, 58 y.o.   MRN: [id number]  Chief Complaint  Patient presents with   Leg Pain    HPI Patient is in today for leg pain. This is bilateral and occurring for 4 weeks. Occurs most days. Pain occurs in right leg first. Pain typically occurs first thing in the morning upon standing out of the bed. Her right leg feels heavy, kind of numb. Her last 4 toes then start cramping. This lasts for about 15 seconds. Then she starts having a burning down from her left lower back that radiates down the front of her left thigh to her left foot. This has also occurred a few times through out the day but typically occurs when standing from a lying position. Sometimes has tingling in her feet. Denies back pain, injury, saddle anesthesia, weakness, changes in bowel or bladder control. She has started sleeping on her back to see if this would help however this has not helped.   BP is elevated today. She does not check her blood pressure at home. No dx of HTN. Denies chest pain, shortness of breath, dizziness.   She is fasting this morning.   ROS As per HPI.      Objective:    BP (!) 153/85   Pulse (!) 102   Temp 97.6 F (36.4 C) (Temporal)   Ht 5' 7 (1.702 m)   Wt (!) 328 lb (148.8 kg)   SpO2 99%   BMI 51.37 kg/m    Physical Exam Vitals and nursing note reviewed.  Constitutional:      General: She is not in acute distress.    Appearance: She is obese. She is not ill-appearing, toxic-appearing or diaphoretic.  Cardiovascular:     Rate and Rhythm: Normal rate and regular rhythm.     Heart sounds: Normal heart sounds. No murmur heard. Pulmonary:     Effort: Pulmonary effort is normal. No respiratory distress.     Breath sounds: Normal breath sounds. No wheezing, rhonchi or rales.  Musculoskeletal:     Lumbar back: No swelling, edema, deformity, signs of trauma, tenderness or bony tenderness. Normal  range of motion. Negative right straight leg raise test and negative left straight leg raise test.     Right lower leg: No swelling. No edema.     Left lower leg: No swelling. No edema.  Skin:    General: Skin is warm and dry.  Neurological:     General: No focal deficit present.     Mental Status: She is alert and oriented to person, place, and time.  Psychiatric:        Mood and Affect: Mood normal.        Behavior: Behavior normal.     No results found for any visits on 01/11/24.      Assessment & Plan:   Catherine Walters was seen today for leg pain.  Diagnoses and all orders for this visit:  Bilateral leg pain -     CMP14+EGFR -     DG Lumbar Spine 2-3 Views; Future  Paresthesia -     TSH -     Vitamin B12 -     CBC with Differential/Platelet -     DG Lumbar Spine 2-3 Views; Future -     Bayer DCA Hb A1c Waived  Elevated BP without diagnosis of hypertension  Encounter for screening for lipid disorder -  Lipid panel   ? Lumbar radiculopathy. She will return for lumbar xray. Will check labs as above for other potential causes of neuropathy, electrolyte imbalance, dehydration. BP is elevated today. Will have her follow up next week and recheck. Recommended checking BP at home as well.   The patient indicates understanding of these issues and agrees with the plan.  Catherine CHRISTELLA Search, FNP

## 2024-01-12 LAB — CMP14+EGFR
ALT: 16 IU/L (ref 0–32)
AST: 13 IU/L (ref 0–40)
Albumin: 3.9 g/dL (ref 3.8–4.9)
Alkaline Phosphatase: 168 IU/L — ABNORMAL HIGH (ref 44–121)
BUN/Creatinine Ratio: 17 (ref 9–23)
BUN: 15 mg/dL (ref 6–24)
Bilirubin Total: 0.3 mg/dL (ref 0.0–1.2)
CO2: 23 mmol/L (ref 20–29)
Calcium: 9.2 mg/dL (ref 8.7–10.2)
Chloride: 102 mmol/L (ref 96–106)
Creatinine, Ser: 0.9 mg/dL (ref 0.57–1.00)
Globulin, Total: 2.9 g/dL (ref 1.5–4.5)
Glucose: 126 mg/dL — ABNORMAL HIGH (ref 70–99)
Potassium: 4.6 mmol/L (ref 3.5–5.2)
Sodium: 139 mmol/L (ref 134–144)
Total Protein: 6.8 g/dL (ref 6.0–8.5)
eGFR: 75 mL/min/1.73 (ref >59)

## 2024-01-12 LAB — CBC WITH DIFFERENTIAL/PLATELET
Basophils Absolute: 0 x10E3/uL (ref 0.0–0.2)
Basos: 1 %
EOS (ABSOLUTE): 0.1 x10E3/uL (ref 0.0–0.4)
Eos: 2 %
Hematocrit: 38 % (ref 34.0–46.6)
Hemoglobin: 12 g/dL (ref 11.1–15.9)
Immature Grans (Abs): 0 x10E3/uL (ref 0.0–0.1)
Immature Granulocytes: 0 %
Lymphocytes Absolute: 1.9 x10E3/uL (ref 0.7–3.1)
Lymphs: 36 %
MCH: 26 pg — ABNORMAL LOW (ref 26.6–33.0)
MCHC: 31.6 g/dL (ref 31.5–35.7)
MCV: 82 fL (ref 79–97)
Monocytes Absolute: 0.5 x10E3/uL (ref 0.1–0.9)
Monocytes: 10 %
Neutrophils Absolute: 2.7 x10E3/uL (ref 1.4–7.0)
Neutrophils: 51 %
Platelets: 350 x10E3/uL (ref 150–450)
RBC: 4.61 x10E6/uL (ref 3.77–5.28)
RDW: 13.5 % (ref 11.7–15.4)
WBC: 5.1 x10E3/uL (ref 3.4–10.8)

## 2024-01-12 LAB — TSH: TSH: 2.61 u[IU]/mL (ref 0.450–4.500)

## 2024-01-12 LAB — VITAMIN B12: Vitamin B-12: 276 pg/mL (ref 232–1245)

## 2024-01-14 ENCOUNTER — Ambulatory Visit: Payer: Self-pay | Admitting: Family Medicine

## 2024-01-14 DIAGNOSIS — M79604 Pain in right leg: Secondary | ICD-10-CM

## 2024-01-14 DIAGNOSIS — R748 Abnormal levels of other serum enzymes: Secondary | ICD-10-CM

## 2024-01-16 ENCOUNTER — Encounter (INDEPENDENT_AMBULATORY_CARE_PROVIDER_SITE_OTHER): Payer: Self-pay | Admitting: *Deleted

## 2024-01-16 ENCOUNTER — Ambulatory Visit: Admitting: Family Medicine

## 2024-01-16 ENCOUNTER — Ambulatory Visit (INDEPENDENT_AMBULATORY_CARE_PROVIDER_SITE_OTHER)

## 2024-01-16 ENCOUNTER — Encounter: Payer: Self-pay | Admitting: Family Medicine

## 2024-01-16 VITALS — BP 163/85 | HR 91 | Temp 98.0°F | Ht 67.0 in | Wt 327.2 lb

## 2024-01-16 DIAGNOSIS — I152 Hypertension secondary to endocrine disorders: Secondary | ICD-10-CM | POA: Diagnosis not present

## 2024-01-16 DIAGNOSIS — M79604 Pain in right leg: Secondary | ICD-10-CM

## 2024-01-16 DIAGNOSIS — Z124 Encounter for screening for malignant neoplasm of cervix: Secondary | ICD-10-CM

## 2024-01-16 DIAGNOSIS — E1159 Type 2 diabetes mellitus with other circulatory complications: Secondary | ICD-10-CM

## 2024-01-16 DIAGNOSIS — Z1211 Encounter for screening for malignant neoplasm of colon: Secondary | ICD-10-CM

## 2024-01-16 DIAGNOSIS — R202 Paresthesia of skin: Secondary | ICD-10-CM | POA: Diagnosis not present

## 2024-01-16 DIAGNOSIS — M79605 Pain in left leg: Secondary | ICD-10-CM

## 2024-01-16 DIAGNOSIS — Z0001 Encounter for general adult medical examination with abnormal findings: Secondary | ICD-10-CM

## 2024-01-16 DIAGNOSIS — Z0000 Encounter for general adult medical examination without abnormal findings: Secondary | ICD-10-CM

## 2024-01-16 DIAGNOSIS — E1165 Type 2 diabetes mellitus with hyperglycemia: Secondary | ICD-10-CM | POA: Diagnosis not present

## 2024-01-16 DIAGNOSIS — Z23 Encounter for immunization: Secondary | ICD-10-CM

## 2024-01-16 MED ORDER — BLOOD GLUCOSE TEST VI STRP: 1.0000 | ORAL_STRIP | Freq: Three times a day (TID) | 3 refills | Status: AC

## 2024-01-16 MED ORDER — LANCETS MISC. MISC: 1.0000 | Freq: Three times a day (TID) | 0 refills | Status: AC

## 2024-01-16 MED ORDER — BLOOD GLUCOSE MONITORING SUPPL DEVI: 1.0000 | Freq: Three times a day (TID) | 0 refills | Status: AC

## 2024-01-16 MED ORDER — LANCET DEVICE MISC: 1.0000 | Freq: Three times a day (TID) | 0 refills | Status: AC

## 2024-01-16 MED ORDER — LOSARTAN POTASSIUM 25 MG PO TABS: 25.0000 mg | ORAL_TABLET | Freq: Every day | ORAL | 1 refills | Status: DC

## 2024-01-16 NOTE — Patient Instructions (Signed)

## 2024-01-16 NOTE — Progress Notes (Signed)
 Complete physical exam  Patient: Catherine Walters   DOB: 1966/01/30   58 y.o. Female  MRN: [id number]  Subjective:    Chief Complaint  Patient presents with   Annual Exam    Catherine Walters is a 58 y.o. female who presents today for a complete physical exam. She reports consuming a healthy diet . The patient does not participate in regular exercise at present. She generally feels well. She reports sleeping poorly since her leg pain started. She does have additional problems to discuss today.   Recent labs revealed an A1c on 6.8. She has improved her diet over the last week. Reports that she previously has had a 60-70 lb weight loss but then gained it back. She is motivated to lose weight again.   BP is elevated again today in the office. She has been having some HAs but denies other symptoms. She is agreeable to medication today.   Reports leg pain is unchanged since her visit last week. She is going to have an xray done today.   Most recent fall risk assessment:    01/16/2024   11:04 AM  Fall Risk   Falls in the past year? 0     Most recent depression screenings:    01/16/2024   11:05 AM 01/11/2024    8:50 AM  PHQ 2/9 Scores  PHQ - 2 Score 0 0  PHQ- 9 Score 0 0        Patient Care Team: Joesph Annabella HERO, FNP as PCP - General (Family Medicine)   Outpatient Medications Prior to Visit  Medication Sig   omeprazole (PRILOSEC OTC) 20 MG tablet Take 20 mg by mouth daily as needed (heartburn).   No facility-administered medications prior to visit.    ROS Negative unless specially indicated above in HPI.     Objective:     BP (!) 163/85   Pulse 91   Temp 98 F (36.7 C) (Temporal)   Ht 5' 7 (1.702 m)   Wt (!) 327 lb 3.2 oz (148.4 kg)   SpO2 97%   BMI 51.25 kg/m    Physical Exam Vitals and nursing note reviewed.  Constitutional:      General: She is not in acute distress.    Appearance: She is obese. She is not ill-appearing, toxic-appearing or  diaphoretic.  HENT:     Head: Normocephalic.     Right Ear: Tympanic membrane, ear canal and external ear normal.     Left Ear: Tympanic membrane, ear canal and external ear normal.     Nose: Nose normal.     Mouth/Throat:     Mouth: Mucous membranes are moist.     Pharynx: Oropharynx is clear.  Eyes:     Extraocular Movements: Extraocular movements intact.     Conjunctiva/sclera: Conjunctivae normal.     Pupils: Pupils are equal, round, and reactive to light.  Cardiovascular:     Rate and Rhythm: Normal rate and regular rhythm.     Pulses: Normal pulses.     Heart sounds: Normal heart sounds. No murmur heard.    No friction rub. No gallop.  Pulmonary:     Effort: Pulmonary effort is normal.     Breath sounds: Normal breath sounds.  Abdominal:     General: Bowel sounds are normal. There is no distension.     Palpations: Abdomen is soft. There is no mass.     Tenderness: There is no abdominal tenderness. There is no guarding.  Musculoskeletal:        General: No swelling or tenderness. Normal range of motion.     Cervical back: Normal range of motion and neck supple. No tenderness.     Lumbar back: Negative right straight leg raise test and negative left straight leg raise test.     Right lower leg: No edema.     Left lower leg: No edema.  Skin:    General: Skin is warm and dry.     Capillary Refill: Capillary refill takes less than 2 seconds.     Findings: No lesion or rash.  Neurological:     General: No focal deficit present.     Mental Status: She is alert and oriented to person, place, and time.     Cranial Nerves: No cranial nerve deficit.     Motor: No weakness.     Gait: Gait normal.  Psychiatric:        Mood and Affect: Mood normal.        Behavior: Behavior normal.        Thought Content: Thought content normal.        Judgment: Judgment normal.      No results found for any visits on 01/16/24.     Assessment & Plan:    Routine Health Maintenance and  Physical Exam  Catherine Walters was seen today for annual exam.  Diagnoses and all orders for this visit:  Routine general medical examination at a health care facility  Type 2 diabetes mellitus with hyperglycemia, without long-term current use of insulin (HCC) A1c 6.8 today, at goal of <7. Declines medication currently. Will work on Education officer, community. She is agreeable to an ACE/ARB. Will check fasting lipid panel at follow up in 2 weeks. Glucometer ordered. Education regarding diet, exercise, disease process and progression discussed. Discussed need for early eye exam. Microalbumin today. Foot exam done today. Diet and exercise.  -     Microalbumin / creatinine urine ratio -     Blood Glucose Monitoring Suppl DEVI; 1 each by Does not apply route in the morning, at noon, and at bedtime. May substitute to any manufacturer covered by patient's insurance. -     Glucose Blood (BLOOD GLUCOSE TEST STRIPS) STRP; 1 each by In Vitro route in the morning, at noon, and at bedtime. May substitute to any manufacturer covered by patient's insurance.DX E11.9 -     Lancet Device MISC; 1 each by Does not apply route in the morning, at noon, and at bedtime. May substitute to any manufacturer covered by patient's insurance. -     Lancets Misc. MISC; 1 each by Does not apply route in the morning, at noon, and at bedtime. May substitute to any manufacturer covered by patient's insurance.  Hypertension associated with type 2 diabetes mellitus (HCC) Uncontrolled. Start losartan  as below. Monitor BP at home. Will follow up in 2 weeks for recheck.  -     losartan  (COZAAR ) 25 MG tablet; Take 1 tablet (25 mg total) by mouth daily.  Bilateral leg pain ? Radiculopathy. Xray today in office. Will notify when report is available.   Screening for colon cancer -     Ambulatory referral to Gastroenterology  Screening for cervical cancer -     Ambulatory referral to Obstetrics / Gynecology  Need for vaccination -     Tdap  vaccine greater than or equal to 7yo IM    Immunization History  Administered Date(s) Administered   Janssen (J&J) SARS-COV-2 Vaccination 09/06/2019  Health Maintenance  Topic Date Due   Diabetic kidney evaluation - Urine ACR  Never done   Colonoscopy  Never done   Cervical Cancer Screening (HPV/Pap Cotest)  03/28/2014   MAMMOGRAM  07/15/2021   Hepatitis B Vaccines (1 of 3 - 19+ 3-dose series) 01/15/2025 (Originally 02/03/1987)   Hepatitis C Screening  01/15/2025 (Originally 02/02/1986)   HIV Screening  01/15/2025 (Originally 02/03/1983)   COVID-19 Vaccine (2 - 2024-25 season) 01/31/2025 (Originally 02/18/2023)   Zoster Vaccines- Shingrix  (1 of 2) 04/17/2025 (Originally 02/02/2018)   INFLUENZA VACCINE  01/18/2024   Diabetic kidney evaluation - eGFR measurement  01/10/2025   DTaP/Tdap/Td (2 - Td or Tdap) 01/15/2034   HPV VACCINES  Aged Out   Meningococcal B Vaccine  Aged Out    Discussed health benefits of physical activity, and encouraged her to engage in regular exercise appropriate for her age and condition.  Problem List Items Addressed This Visit   None Visit Diagnoses       Routine general medical examination at a health care facility    -  Primary     Type 2 diabetes mellitus with hyperglycemia, without long-term current use of insulin (HCC)       Relevant Medications   losartan  (COZAAR ) 25 MG tablet   Blood Glucose Monitoring Suppl DEVI   Glucose Blood (BLOOD GLUCOSE TEST STRIPS) STRP   Lancet Device MISC   Lancets Misc. MISC   Other Relevant Orders   Microalbumin / creatinine urine ratio     Hypertension associated with type 2 diabetes mellitus (HCC)       Relevant Medications   losartan  (COZAAR ) 25 MG tablet     Bilateral leg pain         Screening for colon cancer       Relevant Orders   Ambulatory referral to Gastroenterology     Screening for cervical cancer       Relevant Orders   Ambulatory referral to Obstetrics / Gynecology     Need for vaccination        Relevant Orders   Tdap vaccine greater than or equal to 7yo IM (Completed)      Return in about 2 weeks (around 01/30/2024) for BP follow up with fasting cholesterol check and BMP.   The patient indicates understanding of these issues and agrees with the plan.  Annabella CHRISTELLA Search, FNP

## 2024-01-17 ENCOUNTER — Ambulatory Visit: Payer: Self-pay | Admitting: Family Medicine

## 2024-01-17 LAB — MICROALBUMIN / CREATININE URINE RATIO
Creatinine, Urine: 73.8 mg/dL
Microalb/Creat Ratio: 6 mg/g{creat} (ref 0–29)
Microalbumin, Urine: 4.7 ug/mL

## 2024-01-18 LAB — ALKALINE PHOSPHATASE ISOENZYMES
Alkaline Phosphatase: 164 IU/L — AB (ref 44–121)
BONE FRACTION %:: 28
Bone Fraction IU/L:: 46 IU/L (ref 18–57)
INTESTINAL FRAC.%:: 1
INTESTINALFRAC.IU/L:: 2 IU/L (ref 0–14)
LIVER FRACTION %:: 71
Liver Fraction IU/L:: 116 IU/L — AB (ref 23–85)

## 2024-01-18 LAB — SPECIMEN STATUS REPORT

## 2024-02-01 ENCOUNTER — Ambulatory Visit (INDEPENDENT_AMBULATORY_CARE_PROVIDER_SITE_OTHER): Admitting: Family Medicine

## 2024-02-01 ENCOUNTER — Encounter: Payer: Self-pay | Admitting: Family Medicine

## 2024-02-01 VITALS — BP 144/77 | HR 95 | Temp 97.9°F | Ht 67.0 in | Wt 326.8 lb

## 2024-02-01 DIAGNOSIS — R748 Abnormal levels of other serum enzymes: Secondary | ICD-10-CM | POA: Diagnosis not present

## 2024-02-01 DIAGNOSIS — E1165 Type 2 diabetes mellitus with hyperglycemia: Secondary | ICD-10-CM | POA: Diagnosis not present

## 2024-02-01 DIAGNOSIS — L918 Other hypertrophic disorders of the skin: Secondary | ICD-10-CM

## 2024-02-01 DIAGNOSIS — E1159 Type 2 diabetes mellitus with other circulatory complications: Secondary | ICD-10-CM

## 2024-02-01 DIAGNOSIS — I152 Hypertension secondary to endocrine disorders: Secondary | ICD-10-CM

## 2024-02-01 DIAGNOSIS — M79604 Pain in right leg: Secondary | ICD-10-CM

## 2024-02-01 DIAGNOSIS — M79605 Pain in left leg: Secondary | ICD-10-CM

## 2024-02-01 MED ORDER — LOSARTAN POTASSIUM 50 MG PO TABS: 50.0000 mg | ORAL_TABLET | Freq: Every day | ORAL | 3 refills | Status: DC

## 2024-02-01 NOTE — Patient Instructions (Signed)
 [facility] [phone]

## 2024-02-01 NOTE — Progress Notes (Signed)
 Acute Office Visit  Subjective:     Patient ID: Catherine Walters, female    DOB: [birth year], 58 y.o.   MRN: [id number]  Chief Complaint  Patient presents with   Hypertension    Hypertension    History of Present Illness   Catherine Walters is a 58 year old female with hypertension who presents for follow-up on blood pressure management.  Hypertension and antihypertensive therapy - Blood pressure remains above target despite current therapy with losartan . - No chest pain or shortness of breath. - Experiences swelling in feet and ankles intermittently - Feels well overall on losartan .  Glycemic control - Fasting blood sugars are around 120 mg/dL. - Has made improvements in diet but has slacked off recently due to upcoming travel and family events.  Hepatic laboratory abnormalities - Elevated alkaline phosphatase with liver fraction elevation. - No nausea, vomiting, or right upper quadrant pain. - Liver ultrasound is scheduled.  Muscle spasms and leg pain - Intermittent, brief, painful muscle spasms in legs, toes - No episodes in the past two days. - Recent episode of facial twitching but no further episodes.  Pruritic skin tags - Skin tags on neck have started to itch, especially in the summer.       ROS As per HPI.      Objective:    BP (!) 144/77   Pulse 95   Temp 97.9 F (36.6 C) (Temporal)   Ht 5' 7 (1.702 m)   Wt (!) 326 lb 12.8 oz (148.2 kg)   SpO2 99%   BMI 51.18 kg/m  BP Readings from Last 3 Encounters:  02/01/24 (!) 144/77  01/16/24 (!) 163/85  01/11/24 (!) 153/85   Wt Readings from Last 3 Encounters:  02/01/24 (!) 326 lb 12.8 oz (148.2 kg)  01/16/24 (!) 327 lb 3.2 oz (148.4 kg)  01/11/24 (!) 328 lb (148.8 kg)     Physical Exam Vitals and nursing note reviewed.  Constitutional:      General: She is not in acute distress.    Appearance: She is obese. She is not ill-appearing, toxic-appearing or diaphoretic.  Neck:     Comments: Skin  tags to anterior neck. No signs of infection, no rash Cardiovascular:     Rate and Rhythm: Normal rate and regular rhythm.     Heart sounds: Normal heart sounds. No murmur heard. Pulmonary:     Effort: Pulmonary effort is normal. No respiratory distress.     Breath sounds: Normal breath sounds. No wheezing, rhonchi or rales.  Musculoskeletal:     Right lower leg: No edema.     Left lower leg: No edema.  Skin:    General: Skin is warm and dry.  Neurological:     General: No focal deficit present.     Mental Status: She is alert and oriented to person, place, and time.  Psychiatric:        Mood and Affect: Mood normal.        Behavior: Behavior normal.     No results found for any visits on 02/01/24.      Assessment & Plan:   Joniya was seen today for hypertension.  Diagnoses and all orders for this visit:  Hypertension associated with type 2 diabetes mellitus (HCC) Blood pressure improved with losartan  but not at target.  - Increase losartan  to 50 mg daily. - Check kidney function and cholesterol levels today. - Schedule nurse visit in two weeks for blood pressure check. -  BMP8+EGFR -     Lipid panel -     losartan  (COZAAR ) 50 MG tablet; Take 1 tablet (50 mg total) by mouth daily.  Type 2 diabetes mellitus with hyperglycemia, without long-term current use of insulin (HCC) Fasting blood sugars around 120 mg/dL indicate impaired fasting glucose. Some dietary improvements noted. - Recheck A1c in three months. -     Lipid panel  Elevated alkaline phosphatase level Denies symptoms.  - Perform liver ultrasound to assess liver and gallbladder. - Review ultrasound results once available.  Morbid obesity (HCC) Weight trending down.   Bilateral leg pain Lumbar X-ray shows mild degenerative changes. Contact info for ortho provided for patient to schedule evaluation.  Skin tags, multiple acquired - Advise on keeping area dry to reduce itching. - Discuss  over-the-counter options for skin tag removal. - Offer removal vs referral to dermatologist if desired.    Return in about 2 weeks (around 02/15/2024) for nurse visit for BP check, 3 months for A1c.  The patient indicates understanding of these issues and agrees with the plan.  Catherine CHRISTELLA Search, FNP

## 2024-02-02 LAB — BMP8+EGFR
BUN/Creatinine Ratio: 13 (ref 9–23)
BUN: 13 mg/dL (ref 6–24)
CO2: 24 mmol/L (ref 20–29)
Calcium: 9 mg/dL (ref 8.7–10.2)
Chloride: 103 mmol/L (ref 96–106)
Creatinine, Ser: 1 mg/dL (ref 0.57–1.00)
Glucose: 130 mg/dL — ABNORMAL HIGH (ref 70–99)
Potassium: 4.1 mmol/L (ref 3.5–5.2)
Sodium: 140 mmol/L (ref 134–144)
eGFR: 67 mL/min/1.73 (ref >59)

## 2024-02-02 LAB — LIPID PANEL
Chol/HDL Ratio: 2.3 ratio (ref 0.0–4.4)
Cholesterol, Total: 113 mg/dL (ref 100–199)
HDL: 49 mg/dL (ref >39)
LDL Chol Calc (NIH): 51 mg/dL (ref 0–99)
Triglycerides: 55 mg/dL (ref 0–149)
VLDL Cholesterol Cal: 13 mg/dL (ref 5–40)

## 2024-02-04 ENCOUNTER — Ambulatory Visit: Payer: Self-pay | Admitting: Family Medicine

## 2024-02-04 MED ORDER — ATORVASTATIN CALCIUM 20 MG PO TABS: 20.0000 mg | ORAL_TABLET | Freq: Every day | ORAL | 3 refills | Status: AC

## 2024-02-05 ENCOUNTER — Ambulatory Visit (HOSPITAL_COMMUNITY): Payer: Self-pay

## 2024-02-06 ENCOUNTER — Ambulatory Visit (HOSPITAL_COMMUNITY)
Admission: RE | Admit: 2024-02-06 | Discharge: 2024-02-06 | Disposition: A | Discharge status: Home / Self Care | Payer: Self-pay | Source: Ambulatory Visit | Attending: Family Medicine | Admitting: Family Medicine

## 2024-02-06 DIAGNOSIS — R748 Abnormal levels of other serum enzymes: Secondary | ICD-10-CM | POA: Diagnosis present

## 2024-02-07 ENCOUNTER — Other Ambulatory Visit (HOSPITAL_COMMUNITY): Payer: Self-pay

## 2024-02-13 ENCOUNTER — Ambulatory Visit: Admitting: Surgical

## 2024-02-13 ENCOUNTER — Ambulatory Visit: Payer: Self-pay | Admitting: Family Medicine

## 2024-02-13 DIAGNOSIS — M79604 Pain in right leg: Secondary | ICD-10-CM | POA: Diagnosis not present

## 2024-02-13 DIAGNOSIS — Z9049 Acquired absence of other specified parts of digestive tract: Secondary | ICD-10-CM | POA: Insufficient documentation

## 2024-02-13 DIAGNOSIS — R748 Abnormal levels of other serum enzymes: Secondary | ICD-10-CM

## 2024-02-13 DIAGNOSIS — K838 Other specified diseases of biliary tract: Secondary | ICD-10-CM | POA: Insufficient documentation

## 2024-02-13 DIAGNOSIS — M79605 Pain in left leg: Secondary | ICD-10-CM | POA: Diagnosis not present

## 2024-02-15 ENCOUNTER — Ambulatory Visit: Admitting: *Deleted

## 2024-02-15 VITALS — BP 140/87 | HR 97

## 2024-02-15 DIAGNOSIS — K838 Other specified diseases of biliary tract: Secondary | ICD-10-CM

## 2024-02-15 DIAGNOSIS — R748 Abnormal levels of other serum enzymes: Secondary | ICD-10-CM

## 2024-02-15 NOTE — Progress Notes (Signed)
 Pt came in today for BP check. She doesn't check at home but has been taking her losartan  50 everyday. Denies headache, visual disturbances, palpitations, dizziness or weakness. Her BP readings in office today are 141/89 and 140/87. While pt was in with me I reviewed her RUQ ultrasound findings and placed urgent referral to GI. Advised pt Tiffany would be back in office Wednesday and I would route this note to her for any medication adjustments or advice. Also advised pt to get a BP cuff to monitor at home and if hr readings are consistently 140/90 or higher to let us  know and pt voiced understanding.

## 2024-02-20 NOTE — Telephone Encounter (Signed)
 Reviewed results with patient and patient voiced understanding. Also made her aware that referral has also been placed for her to go to [facility] in [place]. Gave her the phone # to call and make an appt.

## 2024-02-21 ENCOUNTER — Telehealth: Payer: Self-pay | Admitting: *Deleted

## 2024-02-21 NOTE — Telephone Encounter (Signed)
 Spoke to patient. she will increase Losartan  to 100 mg daily. Appointment made 03/28/2024 to follow up on medication efficacy.

## 2024-02-21 NOTE — Telephone Encounter (Signed)
-----   Message from Annabella CHRISTELLA Search sent at 02/20/2024  3:41 PM EDT ----- Increase losartan  to 100 mg daily. Follow up in 4 weeks. ----- Message ----- From: Randine Arnulfo MATSU, LPN Sent: 1/70/7974   9:25 AM EDT To: Annabella CHRISTELLA Search, FNP

## 2024-02-24 ENCOUNTER — Encounter: Payer: Self-pay | Admitting: Surgical

## 2024-02-24 NOTE — Progress Notes (Signed)
 Office Visit Note   Patient: Catherine Walters           Date of Birth: 03/16/1966           MRN: [id number] Visit Date: 02/13/2024 Requested by: Joesph Annabella HERO, FNP [id number] [place],  [place] [address] PCP: Joesph Annabella HERO, FNP  Subjective: Chief Complaint  Patient presents with   bilateral leg pain    HPI: Catherine Walters is a 58 y.o. female who presents to the office reporting bilateral leg pain.  Patient states that she has atraumatic onset of symptoms that she noticed starting around the end of May 2025.  No history of injury.  She describes a severe pressure-like pain in her right leg that will last for about 15 seconds before it radiates into the left leg and causes burning pain in the left leg that radiates from the buttock down the posterior aspect of the leg to the toes.  Has never had the symptoms before.  She was having symptoms about every day but then had a break for about 10 days before having recurrence and now she has had no symptoms for 8 days.  She has history of chronic low back pain but this pain is no worse than typical over the last few months.  She has no major medical history.  She had denies any neurologic history in her family.  No diplopia.  No headaches.  The pain in the left leg will linger after it strikes.  She denies any weakness.  No upper extremity symptoms.  No involvement in her right buttock..                ROS: All systems reviewed are negative as they relate to the chief complaint within the history of present illness.  Patient denies fevers or chills.  Assessment & Plan: Visit Diagnoses:  1. Pain in right leg   2. Pain in left leg     Plan: Impression 58 year old female with bilateral leg radicular symptoms that come and go.  Do not seem related to any increase in her typical low back pain.  She had recent lumbar spine radiographs demonstrating minimal degenerative changes.  We discussed options available to patient.  With the lack of  weakness/red flag symptoms/nebulous nature of her symptoms with the pain that starts in the right leg and radiates into the left leg, feel that there is a low chance this is related to lumbar spine pathology.  Main options for Artelia would be trying neuropathic pain medication such as gabapentin versus Lyrica versus trying MRI of the lumbar spine for further evaluation of potential pathology versus referral for neurology.  Think a referral for her to see neurologist would be the best option for her if she wants to work this up.  She will consider her options but neurology referral was placed.  She is seeing a chiropractor as well to see if they can help with her symptoms.  Follow-Up Instructions: No follow-ups on file.   Orders:  Orders Placed This Encounter  Procedures   Ambulatory referral to Neurology   No orders of the defined types were placed in this encounter.     Procedures: No procedures performed   Clinical Data: No additional findings.  Objective: Vital Signs: There were no vitals taken for this visit.  Physical Exam:  Constitutional: Patient appears well-developed HEENT:  Head: Normocephalic Eyes:EOM are normal Neck: Normal range of motion Cardiovascular: Normal rate Pulmonary/chest: Effort normal Neurologic:  Patient is alert Skin: Skin is warm Psychiatric: Patient has normal mood and affect  Ortho Exam: Ortho exam demonstrates intact hip flexion, quadricep, hamstring, dorsiflexion, plantarflexion, EHL strength rated 5/5 bilaterally.  No clonus noted bilaterally.  Negative straight leg raise bilaterally.  No atrophy noted throughout the lower extremity.  No pain with hip range of motion bilaterally.  Ambulates without any instability.  Palp DP pulse in bilateral lower extremities.  Specialty Comments:  No specialty comments available.  Imaging: No results found.   PMFS History: Patient Active Problem List   Diagnosis Date Noted   History of cholecystectomy  02/13/2024   Common bile duct dilation 02/13/2024   Morbid obesity (HCC) 02/01/2024   Type 2 diabetes mellitus with hyperglycemia, without long-term current use of insulin (HCC) 02/01/2024   Hypertension associated with type 2 diabetes mellitus (HCC) 02/01/2024   Elevated alkaline phosphatase level 02/01/2024   Empyema of gallbladder    Cholecystitis, acute    Past Medical History:  Diagnosis Date   GERD (gastroesophageal reflux disease)     Family History  Problem Relation Age of Onset   Diabetes Mother    Hypertension Mother    Thyroid disease Mother    Arthritis Mother    Thyroid disease Sister    Thyroid disease Brother    Diabetes Brother    Ovarian cancer Maternal Grandmother    Diabetes Maternal Grandmother    Hypertension Maternal Grandmother    Dementia Paternal Grandfather    Mental illness Son     Past Surgical History:  Procedure Laterality Date   CHOLECYSTECTOMY N/A 07/27/2021   Procedure: LAPAROSCOPIC CHOLECYSTECTOMY;  Surgeon: Evonnie Dorothyann LABOR, DO;  Location: [facility];  Service: General;  Laterality: N/A;  LM for pt to arrive at 8am   DILATION AND CURETTAGE OF UTERUS  05/2011   Social History   Occupational History   Not on file  Tobacco Use   Smoking status: Former    Types: Cigarettes   Smokeless tobacco: Never   Tobacco comments:    17 years ago  Vaping Use   Vaping status: Never Used  Substance and Sexual Activity   Alcohol use: Not Currently   Drug use: Never   Sexual activity: Not Currently

## 2024-02-26 ENCOUNTER — Encounter (INDEPENDENT_AMBULATORY_CARE_PROVIDER_SITE_OTHER): Payer: Self-pay | Admitting: *Deleted

## 2024-02-26 ENCOUNTER — Telehealth: Payer: Self-pay | Admitting: Surgical

## 2024-02-26 NOTE — Telephone Encounter (Signed)
 Referral message sent to Lanning Crisp with [facility]. They were waiting on Catherine Walters's note, which is now available. I tried to reach pt, no answer. Will call again to advise.

## 2024-02-26 NOTE — Telephone Encounter (Signed)
 Pt called stating [facility] sent a referral for Neuro but no one has called pt. Please call pt about thsi matter at [phone].

## 2024-02-27 ENCOUNTER — Encounter: Payer: Self-pay | Admitting: Neurology

## 2024-02-27 NOTE — Telephone Encounter (Signed)
 Patient is scheduled

## 2024-03-13 ENCOUNTER — Other Ambulatory Visit (HOSPITAL_COMMUNITY)
Admission: RE | Admit: 2024-03-13 | Discharge: 2024-03-13 | Disposition: A | Source: Ambulatory Visit | Attending: Obstetrics & Gynecology | Admitting: Obstetrics & Gynecology

## 2024-03-13 ENCOUNTER — Ambulatory Visit (INDEPENDENT_AMBULATORY_CARE_PROVIDER_SITE_OTHER): Admitting: Obstetrics & Gynecology

## 2024-03-13 ENCOUNTER — Encounter: Payer: Self-pay | Admitting: Obstetrics & Gynecology

## 2024-03-13 VITALS — BP 171/89 | HR 88 | Ht 67.0 in | Wt 326.0 lb

## 2024-03-13 DIAGNOSIS — Z1231 Encounter for screening mammogram for malignant neoplasm of breast: Secondary | ICD-10-CM | POA: Diagnosis not present

## 2024-03-13 DIAGNOSIS — Z01419 Encounter for gynecological examination (general) (routine) without abnormal findings: Secondary | ICD-10-CM | POA: Insufficient documentation

## 2024-03-13 NOTE — Progress Notes (Signed)
 WELL-WOMAN EXAMINATION Patient name: Catherine Walters MRN [id number]  Date of birth: 1966/02/22 Chief Complaint:   Gynecologic Exam  History of Present Illness:   Catherine Walters is a 58 y.o. G66P1011 PM female being seen today for annual exam.    Denies vaginal bleeding since ablation in 2012.  Denies discharge, itching or irritation.  Denies pelvic or abdominal pain.  No acute gyn concerns.  Denies hot flashes or night sweats.    Noting some right leg issues and currently being seen by PCP.  No LMP recorded. Patient is perimenopausal.  The current method of family planning is post menopausal status.    Last pap due today.  Last mammogram: ordered. Last colonoscopy: discussed- agreeable to fecal testing today     02/01/2024    8:52 AM 01/16/2024   11:05 AM 01/11/2024    8:50 AM 07/16/2020    9:30 AM 06/05/2019    3:07 PM  Depression screen PHQ 2/9  Decreased Interest 0 0 0 0 0  Down, Depressed, Hopeless 0 0 0 0 0  PHQ - 2 Score 0 0 0 0 0  Altered sleeping 2 0 0    Tired, decreased energy 0 0 0    Change in appetite 0 0 0    Feeling bad or failure about yourself  0 0 0    Trouble concentrating 0 0 0    Moving slowly or fidgety/restless 0 0 0    Suicidal thoughts 0 0 0    PHQ-9 Score 2 0 0    Difficult doing work/chores Not difficult at all Not difficult at all Not difficult at all        Review of Systems:   Pertinent items are noted in HPI Denies any headaches, blurred vision, fatigue, shortness of breath, chest pain, abdominal pain, bowel movements, urination, or intercourse unless otherwise stated above.  Pertinent History Reviewed:  Reviewed past medical,surgical, social and family history.  Reviewed problem list, medications and allergies. Physical Assessment:   Vitals:   03/13/24 0836 03/13/24 0904  BP: (!) 161/87 (!) 171/89  Pulse: 88 88  Weight: (!) 326 lb (147.9 kg)   Height: 5' 7 (1.702 m)   Body mass index is 51.06 kg/m.        Physical  Examination:   General appearance - well appearing, and in no distress  Mental status - alert, oriented to person, place, and time  Psych:  She has a normal mood and affect  Skin - warm and dry, normal color, no suspicious lesions noted  Chest - effort normal, all lung fields clear to auscultation bilaterally  Heart - normal rate and regular rhythm  Neck:  midline trachea, no thyromegaly or nodules  Breasts - breasts appear normal, no suspicious masses, no skin or nipple changes or  axillary nodes  Abdomen - soft, nontender, nondistended, no masses or organomegaly  Pelvic - VULVA: normal appearing vulva with no masses, tenderness or lesions  VAGINA: normal appearing vagina with normal color and discharge, no lesions  CERVIX: normal appearing cervix without discharge or lesions, no CMT  Thin prep pap is done with HR HPV cotesting  UTERUS: uterus is felt to be normal size, shape, consistency and nontender   ADNEXA: No adnexal masses or tenderness noted.  Rectal - normal rectal, good sphincter tone, no masses felt. Hemoccult: negative  Extremities:  No swelling or varicosities noted  Chaperone: Alan Fischer     Assessment & Plan:  1) Well-Woman Exam -  Pap collected, reviewed ASCCP guidelines - Mammogram ordered - Hemoccult negative reviewed alternative options in the future including Cologuard and colonoscopy  2) Elevated BP - Patient did not take her medicine this morning continue to follow-up with PCP  Orders Placed This Encounter  Procedures   MM 3D SCREENING MAMMOGRAM BILATERAL BREAST    Meds: No orders of the defined types were placed in this encounter.   Follow-up: Return in about 1 year (around 03/13/2025) for Annual.   Brendan Gruwell, DO Attending Obstetrician & Gynecologist, [facility] for [facility], [facility]

## 2024-03-13 NOTE — Patient Instructions (Signed)
Please schedule a mammogram at one of the following locations:  [facility]: [phone]

## 2024-03-17 ENCOUNTER — Telehealth (INDEPENDENT_AMBULATORY_CARE_PROVIDER_SITE_OTHER): Payer: Self-pay

## 2024-03-17 ENCOUNTER — Ambulatory Visit (INDEPENDENT_AMBULATORY_CARE_PROVIDER_SITE_OTHER): Admitting: Gastroenterology

## 2024-03-17 ENCOUNTER — Encounter (INDEPENDENT_AMBULATORY_CARE_PROVIDER_SITE_OTHER): Payer: Self-pay | Admitting: Gastroenterology

## 2024-03-17 VITALS — BP 135/85 | HR 101 | Temp 96.9°F | Ht 67.0 in | Wt 323.9 lb

## 2024-03-17 DIAGNOSIS — Z1211 Encounter for screening for malignant neoplasm of colon: Secondary | ICD-10-CM | POA: Insufficient documentation

## 2024-03-17 DIAGNOSIS — K838 Other specified diseases of biliary tract: Secondary | ICD-10-CM | POA: Diagnosis not present

## 2024-03-17 DIAGNOSIS — K5900 Constipation, unspecified: Secondary | ICD-10-CM

## 2024-03-17 DIAGNOSIS — K760 Fatty (change of) liver, not elsewhere classified: Secondary | ICD-10-CM | POA: Insufficient documentation

## 2024-03-17 DIAGNOSIS — Z6841 Body Mass Index (BMI) 40.0 and over, adult: Secondary | ICD-10-CM | POA: Insufficient documentation

## 2024-03-17 DIAGNOSIS — R748 Abnormal levels of other serum enzymes: Secondary | ICD-10-CM | POA: Insufficient documentation

## 2024-03-17 DIAGNOSIS — K5904 Chronic idiopathic constipation: Secondary | ICD-10-CM | POA: Insufficient documentation

## 2024-03-17 NOTE — Telephone Encounter (Signed)
 PA with [facility] for MRI/MRCP  Case Number: [id number]     Patient Name: Catherine Walters, Catherine Walters DOB: [birth year] Status: Additional Information Received; Pending [facility] Review

## 2024-03-17 NOTE — Patient Instructions (Signed)
 It was very nice to meet you today, as dicussed with will plan for the following :  1) MRI/MRCP  2) Colonoscopy  3) Labs  4) Ensure adequate fluid intake: Aim for 8 glasses of water daily. Follow a high fiber diet: Include foods such as dates, prunes, pears, and kiwi. Use Metamucil twice a day.

## 2024-03-17 NOTE — Progress Notes (Signed)
 Gyanna Jarema Faizan Keora Eccleston , M.D. Gastroenterology & Hepatology [facility] Gastroenterology [address] [place], [place] [address] Primary Care Physician: Joesph Annabella HERO, FNP [address] [place] [address]  Chief Complaint: Elevated liver enzymes alternating bowel movement, abnormal imaging with CBD dilation  History of Present Illness: Catherine Walters is a 58 y.o. female with hypertension, prediabetes and class III obesity who presents for evaluation of Elevated liver enzymes alternating bowel movement, abnormal imaging with CBD dilation  Patient reports that she does not have any abdominal pain but has episodes of abdominal discomfort which comes when she skips days 2 to 3 days without appointment to defecate.  This is a complaint with hard stools alternating with liquid stools.  Patient denies any herbal medications.  Patient denies any generalized itching or yellowing of the skin The patient denies having any nausea, vomiting, fever, chills, hematochezia, melena, hematemesis, abdominal distention, abdominal pain, jaundice, pruritus or weight loss.  Labs from 12/2023 alk phos elevated 168 T. bili 0.3 Fractionated alk phos elevated 160 Hemoglobin A1c 6.8  Last ZHI:wnwz Last Colonoscopy:none  FHx: neg for any gastrointestinal/liver disease, no malignancies Social: neg smoking, alcohol or illicit drug use Surgical: Cholecystectomy  Past Medical History: Past Medical History:  Diagnosis Date   GERD (gastroesophageal reflux disease)    Hypercholesteremia    Hypertension     Past Surgical History: Past Surgical History:  Procedure Laterality Date   CHOLECYSTECTOMY N/A 07/27/2021   Procedure: LAPAROSCOPIC CHOLECYSTECTOMY;  Surgeon: Evonnie Dorothyann LABOR, DO;  Location: [facility];  Service: General;  Laterality: N/A;  LM for pt to arrive at 8am   HYSTEROSCOPY W/ ENDOMETRIAL ABLATION  05/2011    Family History: Family History  Problem Relation Age  of Onset   Diabetes Mother    Hypertension Mother    Thyroid disease Mother    Arthritis Mother    Thyroid disease Sister    Thyroid disease Brother    Diabetes Brother    Ovarian cancer Maternal Grandmother    Diabetes Maternal Grandmother    Hypertension Maternal Grandmother    Dementia Paternal Grandfather    Mental illness Son     Social History: Social History   Tobacco Use  Smoking Status Former   Types: Cigarettes  Smokeless Tobacco Never  Tobacco Comments   17 years ago   Social History   Substance and Sexual Activity  Alcohol Use Not Currently   Social History   Substance and Sexual Activity  Drug Use Never    Allergies: Allergies  Allergen Reactions   Entex T  [Pseudoephedrine-Guaifenesin] Hives   Shellfish Allergy Nausea And Vomiting    Medications: Current Outpatient Medications  Medication Sig Dispense Refill   atorvastatin  (LIPITOR) 20 MG tablet Take 1 tablet (20 mg total) by mouth daily. 90 tablet 3   Blood Glucose Monitoring Suppl DEVI 1 each by Does not apply route in the morning, at noon, and at bedtime. May substitute to any manufacturer covered by patient's insurance. 1 each 0   Glucose Blood (BLOOD GLUCOSE TEST STRIPS) STRP 1 each by In Vitro route in the morning, at noon, and at bedtime. May substitute to any manufacturer covered by patient's insurance.DX E11.9 100 each 3   losartan  (COZAAR ) 50 MG tablet Take 1 tablet (50 mg total) by mouth daily. (Patient taking differently: Take 100 mg by mouth daily.) 90 tablet 3   MAGNESIUM PO Take by mouth.     omeprazole (PRILOSEC OTC) 20 MG tablet  Take 20 mg by mouth daily as needed (heartburn).     No current facility-administered medications for this visit.    Review of Systems: GENERAL: negative for malaise, night sweats HEENT: No changes in hearing or vision, no nose bleeds or other nasal problems. NECK: Negative for lumps, goiter, pain and significant neck swelling RESPIRATORY: Negative for  cough, wheezing CARDIOVASCULAR: Negative for chest pain, leg swelling, palpitations, orthopnea GI: SEE HPI MUSCULOSKELETAL: Negative for joint pain or swelling, back pain, and muscle pain. SKIN: Negative for lesions, rash HEMATOLOGY Negative for prolonged bleeding, bruising easily, and swollen nodes. ENDOCRINE: Negative for cold or heat intolerance, polyuria, polydipsia and goiter. NEURO: negative for tremor, gait imbalance, syncope and seizures. The remainder of the review of systems is noncontributory.   Physical Exam: There were no vitals taken for this visit. GENERAL: The patient is AO x3, in no acute distress. HEENT: Head is normocephalic and atraumatic. EOMI are intact. Mouth is well hydrated and without lesions. NECK: Supple. No masses LUNGS: Clear to auscultation. No presence of rhonchi/wheezing/rales. Adequate chest expansion HEART: RRR, normal s1 and s2. ABDOMEN: Soft, nontender, no guarding, no peritoneal signs, and nondistended. BS +. No masses.   Imaging/Labs: as above     Latest Ref Rng & Units 01/11/2024    8:50 AM 07/16/2020   10:11 AM 06/05/2019    4:00 PM  CBC  WBC 3.4 - 10.8 x10E3/uL 5.1  4.3  6.5   Hemoglobin 11.1 - 15.9 g/dL 87.9  87.7  87.0   Hematocrit 34.0 - 46.6 % 38.0  37.1  39.1   Platelets 150 - 450 x10E3/uL 350  312  421    No results found for: IRON, TIBC, FERRITIN  I personally reviewed and interpreted the available labs, imaging and endoscopic files.  Ultrasound  IMPRESSION: 1. Mildly increased hepatic echogenicity as can be seen in hepatic steatosis. 2. Dilated common bile duct measuring up to 11 mm. This may reflect reservoir effect status post cholecystectomy. Recommend correlation with liver function tests. If there is concern for biliary obstruction, recommend further evaluation with MRCP.  Impression and Plan:  Catherine Walters is a 58 y.o. female with hypertension, prediabetes and class III obesity who presents for  evaluation of Elevated liver enzymes alternating bowel movement, abnormal imaging with CBD dilation  #CBD Dilation #Elevated ALP   Patient has ultrasound findings of CBD dilation to 1 mm which could be accommodation from previous cholecystectomy.  Although alk phos is elevated with alk phos isoenzyme also elevated suggesting its origin from liver.  At this time need to rule out de novo cholelithiasis in the CBD  MRI MRCP AMA, will obtain baseline viral hepatitis profile   #Screening colonoscopy The patient was counseled regarding the importance of colorectal cancer screening, particularly starting at age 64 due to the rising incidence of colorectal cancer in younger individuals. The benefits of screening include early detection of colorectal cancer and precancerous polyps, which can improve treatment outcomes and reduce mortality. Risks associated with screening, particularly colonoscopy, include potential complications such as bleeding and perforation. After deciding different modalities for screening for colon cancer , patient has opted to pursue Colonoscopy   #Constipation  Ensure adequate fluid intake: A 6 im for 8 glasses of water daily. Follow a high fiber diet: Include foods such as dates, prunes, pears, and kiwi. Use Metamucil twice a day.  #BMI 50      - walking at a brisk pace/biking at moderate intesity 2.5-5 hours per week     -  use pedometer/step counter to track activity     - goal to lose 5-10% of initial body weight     - avoid suagry drinks and juices, use zero calorie beverages     - increase water intake     - eat a low carb diet with plenty of veggies and fruit     - Get sufficient sleep 7-8 hrs nightly     - maitain active lifestyle     - avoid alcohol     - recommend 2-3 cups Coffee daily     - Counsel on lowering cholesterol by having a diet rich in vegetables,          protein (avoid red meats) and good fats(fish, salmon).      All questions were  answered.      Zonnie Landen Faizan Danie Diehl, MD Gastroenterology and Hepatology [facility]   This chart has been completed using [facility] Dictation software, and while attempts have been made to ensure accuracy , certain words and phrases may not be transcribed as intended

## 2024-03-18 LAB — CYTOLOGY - PAP
Adequacy: ABSENT
Comment: NEGATIVE
Diagnosis: UNDETERMINED — AB
High risk HPV: NEGATIVE

## 2024-03-20 ENCOUNTER — Telehealth (INDEPENDENT_AMBULATORY_CARE_PROVIDER_SITE_OTHER): Payer: Self-pay

## 2024-03-20 ENCOUNTER — Ambulatory Visit: Payer: Self-pay | Admitting: Obstetrics & Gynecology

## 2024-03-20 NOTE — Telephone Encounter (Signed)
 MRI scheduled for 03/27/2024 at 3pm, calling patient to inform her of appt.

## 2024-03-20 NOTE — Telephone Encounter (Signed)
 Member Name: DELETHA JAFFEE Member ID Number: [id number] Date of Birth: 07-06-1965 Reference Number: [id number] Health Plan: [employer] Approved [id number] Magnetic resonance imaging (MRI), a special  kind of picture of your stomach area without and  with contrast (dye) Is valid from 03/17/2024 to 09/13/2024

## 2024-03-20 NOTE — Telephone Encounter (Addendum)
 ATC pt to schedule TCS and inform pt of MRI appt. No answer, lvm for call back.

## 2024-03-27 ENCOUNTER — Ambulatory Visit (HOSPITAL_COMMUNITY): Admission: RE | Admit: 2024-03-27 | Source: Ambulatory Visit

## 2024-03-28 ENCOUNTER — Ambulatory Visit: Admitting: Family Medicine

## 2024-03-28 ENCOUNTER — Encounter: Payer: Self-pay | Admitting: Family Medicine

## 2024-03-28 VITALS — BP 150/79 | HR 92 | Temp 97.4°F | Ht 67.0 in | Wt 330.4 lb

## 2024-03-28 DIAGNOSIS — R600 Localized edema: Secondary | ICD-10-CM

## 2024-03-28 DIAGNOSIS — E1159 Type 2 diabetes mellitus with other circulatory complications: Secondary | ICD-10-CM

## 2024-03-28 DIAGNOSIS — E1165 Type 2 diabetes mellitus with hyperglycemia: Secondary | ICD-10-CM

## 2024-03-28 DIAGNOSIS — M79604 Pain in right leg: Secondary | ICD-10-CM

## 2024-03-28 DIAGNOSIS — I152 Hypertension secondary to endocrine disorders: Secondary | ICD-10-CM

## 2024-03-28 DIAGNOSIS — M79605 Pain in left leg: Secondary | ICD-10-CM

## 2024-03-28 MED ORDER — LOSARTAN POTASSIUM 100 MG PO TABS: 100.0000 mg | ORAL_TABLET | Freq: Every day | ORAL | 3 refills | Status: AC

## 2024-03-28 MED ORDER — AMLODIPINE BESYLATE 5 MG PO TABS: 5.0000 mg | ORAL_TABLET | Freq: Every day | ORAL | 3 refills | Status: AC

## 2024-03-28 NOTE — Progress Notes (Signed)
 Established Patient Office Visit  Subjective   Patient ID: Catherine Walters, female    DOB: 12-07-1965  Age: 58 y.o. MRN: [id number]  Chief Complaint  Patient presents with   Hypertension    HPI  History of Present Illness   Catherine Walters is a 58 year old female with hypertension who presents for a blood pressure recheck.  Hypertension  - Hypertension managed with losartan  100 mg daily - No home blood pressure monitoring - Blood pressure readings during today's visit: 140/77 mmHg and 150/79 mmHg - Previous blood pressure reading of 135/80 mmHg at a prior appointment - Swelling in feet and ankles, unchanged  Lower extremity pain and paresthesia - Persistent leg pain and burning sensations, especially after prolonged sitting or standing (e.g., nail salon visit, concert) - Episodes last 10-15 seconds, followed by burning sensation in the foot - Orthopedic specialist placed referral to neurologist for further evaluation  Glycemic control and weight management - Blood glucose levels stable, with recent readings around 125 mg/dL and a high of 863 mg/dL - Dietary management ongoing - Difficulty with weight loss due to challenges at social events  Gastrointestinal evaluation - Recent consultation with gastroenterologist - MRI scheduled for October 25th to assess bile duct function - Laboratory evaluation for autoimmune etiologies completed         03/28/2024    7:59 AM 02/01/2024    8:52 AM 01/16/2024   11:05 AM  Depression screen PHQ 2/9  Decreased Interest 0 0 0  Down, Depressed, Hopeless 0 0 0  PHQ - 2 Score 0 0 0  Altered sleeping 1 2 0  Tired, decreased energy 0 0 0  Change in appetite 0 0 0  Feeling bad or failure about yourself  0 0 0  Trouble concentrating 0 0 0  Moving slowly or fidgety/restless 0 0 0  Suicidal thoughts 0 0 0  PHQ-9 Score 1 2 0  Difficult doing work/chores Not difficult at all Not difficult at all Not difficult at all      03/28/2024     7:59 AM 02/01/2024    8:53 AM 01/16/2024   11:05 AM 01/11/2024    8:50 AM  GAD 7 : Generalized Anxiety Score  Nervous, Anxious, on Edge 0 0 0 0  Control/stop worrying 0 0 0 0  Worry too much - different things 0 0 0 0  Trouble relaxing 0 0 0 0  Restless 0 0 0 0  Easily annoyed or irritable 0 0 0 0  Afraid - awful might happen 0 0 0 0  Total GAD 7 Score 0 0 0 0  Anxiety Difficulty Not difficult at all Not difficult at all Not difficult at all Not difficult at all       ROS As per HPI.    Objective:     BP (!) 150/79   Pulse 92   Temp (!) 97.4 F (36.3 C) (Temporal)   Ht 5' 7 (1.702 m)   Wt (!) 330 lb 6.4 oz (149.9 kg)   SpO2 100%   BMI 51.75 kg/m  Wt Readings from Last 3 Encounters:  03/28/24 (!) 330 lb 6.4 oz (149.9 kg)  03/17/24 (!) 323 lb 14.4 oz (146.9 kg)  03/13/24 (!) 326 lb (147.9 kg)   BP Readings from Last 3 Encounters:  03/28/24 (!) 150/79  03/17/24 135/85  03/13/24 (!) 171/89     Physical Exam Vitals and nursing note reviewed.  Constitutional:      General:  She is not in acute distress.    Appearance: She is obese. She is not ill-appearing, toxic-appearing or diaphoretic.  Cardiovascular:     Rate and Rhythm: Normal rate and regular rhythm.     Pulses: Normal pulses.     Heart sounds: Normal heart sounds. No murmur heard. Pulmonary:     Effort: Pulmonary effort is normal. No respiratory distress.     Breath sounds: Normal breath sounds.  Musculoskeletal:     Right lower leg: No edema.     Left lower leg: No edema.  Lymphadenopathy:     Cervical: No cervical adenopathy.  Skin:    General: Skin is warm and dry.  Neurological:     General: No focal deficit present.     Mental Status: She is alert and oriented to person, place, and time.  Psychiatric:        Mood and Affect: Mood normal.        Behavior: Behavior normal.      No results found for any visits on 03/28/24.    The ASCVD Risk score (Arnett DK, et al., 2019) failed to  calculate for the following reasons:   The valid total cholesterol range is 130 to 320 mg/dL    Assessment & Plan:   Calvina was seen today for hypertension.  Diagnoses and all orders for this visit:  Hypertension associated with type 2 diabetes mellitus (HCC) -     amLODipine (NORVASC) 5 MG tablet; Take 1 tablet (5 mg total) by mouth daily. -     losartan  (COZAAR ) 100 MG tablet; Take 1 tablet (100 mg total) by mouth daily.  Peripheral edema  Type 2 diabetes mellitus with hyperglycemia, without long-term current use of insulin (HCC)  Morbid obesity (HCC)  Bilateral leg pain      Hypertension Blood pressure remains elevated with systolic readings of 140/77 and 150/79. Diastolic readings are within target range. Current management includes losartan  100 mg. - Prescribe amlodipine 5 mg daily. - Continue losartan  100 mg daily. - Recheck blood pressure in one month.  Edema of lower extremities Potential side effect of amlodipine at higher doses, but not expected at 5 mg. - Monitor for any increase in swelling with new medication regimen.  Type 2 diabetes mellitus Blood sugar levels well-controlled with recent readings around 125-136 mg/dL. Anticipate A1c will reflect good control. - Recheck A1c in one month.  Obesity Weight trending up. Diet, exercise, weight loss.   Bilateral leg pain Suspect neuropathic pain and muscle spasms in lower extremities. Neurologist referral in place. - Proceed with neurologist appointment on November 17th. - Anticipate nerve conduction testing to evaluate for neuropathy.       Keep scheduled follow up in 1 month.   The patient indicates understanding of these issues and agrees with the plan.   Catherine Walters Search, FNP

## 2024-04-06 ENCOUNTER — Other Ambulatory Visit (HOSPITAL_COMMUNITY): Payer: Self-pay | Admitting: Gastroenterology

## 2024-04-06 DIAGNOSIS — R52 Pain, unspecified: Secondary | ICD-10-CM

## 2024-04-07 ENCOUNTER — Ambulatory Visit (HOSPITAL_COMMUNITY)
Admission: RE | Admit: 2024-04-07 | Discharge: 2024-04-07 | Disposition: A | Discharge status: Home / Self Care | Source: Ambulatory Visit | Attending: Gastroenterology | Admitting: Gastroenterology

## 2024-04-07 DIAGNOSIS — R748 Abnormal levels of other serum enzymes: Secondary | ICD-10-CM | POA: Insufficient documentation

## 2024-04-07 DIAGNOSIS — K760 Fatty (change of) liver, not elsewhere classified: Secondary | ICD-10-CM | POA: Insufficient documentation

## 2024-04-07 DIAGNOSIS — K838 Other specified diseases of biliary tract: Secondary | ICD-10-CM | POA: Insufficient documentation

## 2024-04-07 DIAGNOSIS — R52 Pain, unspecified: Secondary | ICD-10-CM | POA: Insufficient documentation

## 2024-04-07 MED ORDER — GADOBUTROL 1 MMOL/ML IV SOLN
10.0000 mL | Freq: Once | INTRAVENOUS | Status: AC | PRN
  Administered 2024-04-07: 10 mL via INTRAVENOUS

## 2024-04-14 ENCOUNTER — Ambulatory Visit (INDEPENDENT_AMBULATORY_CARE_PROVIDER_SITE_OTHER): Payer: Self-pay | Admitting: Gastroenterology

## 2024-04-14 ENCOUNTER — Encounter (INDEPENDENT_AMBULATORY_CARE_PROVIDER_SITE_OTHER): Payer: Self-pay

## 2024-04-14 NOTE — Progress Notes (Signed)
 Hi Catherine Walters ,  Can you please call the patient and tell the patient the MRI shows bile duct dilation which could be due to prior gallbladder removal but specifically does not show any stones or blockages in the bile duct which is a good news. Also shows a small hiatal hernia which is not an urgent finding.  Thanks,  Albertina Leise Faizan Keevon Henney, MD Gastroenterology and Hepatology [facility] Gastroenterology ===============  MRI/MRCP  IMPRESSION: 1. Status post cholecystectomy. 2. Common bile duct is mildly dilated, measuring up to 0.9 cm in caliber although tapering to the ampulla without calculus or other obstruction. This is most likely benign postoperative biliary ductal dilatation in the absence of clinical or biochemical evidence of cholestasis. 3. Small hiatal hernia.

## 2024-04-21 ENCOUNTER — Encounter: Payer: Self-pay | Admitting: Radiology

## 2024-05-02 ENCOUNTER — Encounter: Payer: Self-pay | Admitting: Family Medicine

## 2024-05-02 ENCOUNTER — Ambulatory Visit (INDEPENDENT_AMBULATORY_CARE_PROVIDER_SITE_OTHER): Payer: Self-pay | Admitting: Family Medicine

## 2024-05-02 VITALS — BP 126/75 | HR 96 | Temp 97.7°F | Ht 67.0 in | Wt 324.4 lb

## 2024-05-02 DIAGNOSIS — K760 Fatty (change of) liver, not elsewhere classified: Secondary | ICD-10-CM

## 2024-05-02 DIAGNOSIS — E1165 Type 2 diabetes mellitus with hyperglycemia: Secondary | ICD-10-CM | POA: Diagnosis not present

## 2024-05-02 DIAGNOSIS — I152 Hypertension secondary to endocrine disorders: Secondary | ICD-10-CM

## 2024-05-02 DIAGNOSIS — E1159 Type 2 diabetes mellitus with other circulatory complications: Secondary | ICD-10-CM

## 2024-05-02 LAB — BAYER DCA HB A1C WAIVED: HB A1C (BAYER DCA - WAIVED): 7.3 % — ABNORMAL HIGH (ref 4.8–5.6)

## 2024-05-02 NOTE — Progress Notes (Signed)
 Established Patient Office Visit  Subjective   Patient ID: Catherine Walters, female    DOB: 04-Mar-1966  Age: 58 y.o. MRN: [id number]  Chief Complaint  Patient presents with   Medical Management of Chronic Issues    HPI  History of Present Illness   Catherine Walters is a 58 year old female with type 2 diabetes who presents for follow-up of her diabetes management.  Glycemic control - Type 2 diabetes mellitus with recent increase in hemoglobin A1c to 7.3% from 6.8% in October - Goal to maintain A1c below 7% - Actively focusing on weight loss and motivated to continue diet and exercise regimen to improve glycemic control over the next three months  Hypertension - Compliant with medication - No chest pain, shortness of breath, edema  HLD - On statin          ROS As per HPI.    Objective:     BP 126/75   Pulse 96   Temp 97.7 F (36.5 C) (Temporal)   Ht 5' 7 (1.702 m)   Wt (!) 324 lb 6.4 oz (147.1 kg)   SpO2 100%   BMI 50.81 kg/m  Wt Readings from Last 3 Encounters:  05/02/24 (!) 324 lb 6.4 oz (147.1 kg)  03/28/24 (!) 330 lb 6.4 oz (149.9 kg)  03/17/24 (!) 323 lb 14.4 oz (146.9 kg)      Physical Exam Vitals and nursing note reviewed.  Constitutional:      General: Catherine Walters is not in acute distress.    Appearance: Catherine Walters is obese. Catherine Walters is not ill-appearing, toxic-appearing or diaphoretic.  Cardiovascular:     Rate and Rhythm: Normal rate and regular rhythm.     Pulses: Normal pulses.     Heart sounds: Normal heart sounds. No murmur heard. Pulmonary:     Effort: Pulmonary effort is normal. No respiratory distress.     Breath sounds: Normal breath sounds.  Musculoskeletal:     Right lower leg: No edema.     Left lower leg: No edema.  Skin:    General: Skin is warm and dry.  Neurological:     General: No focal deficit present.     Mental Status: Catherine Walters is alert and oriented to person, place, and time.  Psychiatric:        Mood and Affect: Mood normal.        Behavior:  Behavior normal.      No results found for any visits on 05/02/24.    The ASCVD Risk score (Arnett DK, et al., 2019) failed to calculate for the following reasons:   The valid total cholesterol range is 130 to 320 mg/dL    Assessment & Plan:   Catherine Walters was seen today for medical management of chronic issues.  Diagnoses and all orders for this visit:  Type 2 diabetes mellitus with hyperglycemia, without long-term current use of insulin (HCC) -     Bayer DCA Hb A1c Waived  Hypertension associated with type 2 diabetes mellitus (HCC)  Metabolic dysfunction-associated steatotic liver disease (MASLD)  Morbid obesity (HCC)   Assessment and Plan    Type 2 diabetes mellitus with hyperglycemia A1c increased to 7.3 from 6.8, above target. Discussed GLP-1 receptor agonists for weight loss and A1c reduction, including benefits, side effects, insurance, and cost. Catherine Walters declines medication today and will work on lifestyle management.  - Continue current diabetes medications. - Encouraged diet, exercise, and weight loss efforts. - Reassess A1c in 3 months.  Morbid obesity due to  excess calories Catherine Walters is motivated for weight loss.  - Encouraged continued diet, exercise, and weight loss efforts.  Fatty liver disease Discussed weight loss and GLP-1 receptor agonists for management. - Encouraged weight loss efforts.  Hypertension Blood pressure well-controlled. - Continue current blood pressure medications.   HLD continue statin.       Return in about 3 months (around 08/02/2024) for chronic follow up.   The patient indicates understanding of these issues and agrees with the plan.  Annabella CHRISTELLA Search, FNP

## 2024-05-05 ENCOUNTER — Ambulatory Visit: Admitting: Neurology

## 2024-05-05 ENCOUNTER — Encounter: Payer: Self-pay | Admitting: Neurology

## 2024-05-05 VITALS — BP 153/91 | HR 98 | Ht 67.0 in | Wt 324.0 lb

## 2024-05-05 DIAGNOSIS — R292 Abnormal reflex: Secondary | ICD-10-CM

## 2024-05-05 DIAGNOSIS — M5416 Radiculopathy, lumbar region: Secondary | ICD-10-CM

## 2024-05-05 NOTE — Patient Instructions (Addendum)
 Start physical therapy for low back strengthening and leg stretching  Start vitamin B12 1000mcg daily  Please send MyChart update in 6-8 weeks

## 2024-05-05 NOTE — Progress Notes (Signed)
 [facility] Neurology [facility] Clinic Note - Initial Visit   Date: 05/05/2024   Catherine Walters MRN: [id number] DOB: [birth year]   Dear [name], PA-C:  Thank you for your kind referral of Catherine Walters for consultation of right leg pain. Although her history is well known to you, please allow us  to reiterate it for the purpose of our medical record. The patient was accompanied to the clinic by self.   Catherine Walters is a 58 y.o. right-handed female with hypertension, hyperlipidemia, prediabetes, and GERD presenting for evaluation of right leg pain.   IMPRESSION/PLAN: Right leg pain and left leg paresthesias, possibly due to lumbar canal stenosis with radiculopathy.  Exam shows brisk reflexes at the patella which may indicate canal stenosis.  I recommend that she start physical therapy for low back strengthening.  Her vitamin B12 is also low-normal, for which I have recommended starting vitamin B12 1000mcg daily.     Patient to provide MyChart update in 6-8 weeks to determine if MRI lumbar spine needs to be ordered.   ------------------------------------------------------------- History of present illness: In June 2025, she developed acute onset of right anterior lower leg pain, described tight pressure.  She reports that her four toes will also curl down.  She felt as if the blood was not flowing.  It lasts about 15 seconds and then resolves.  It occurs every morning.  She as a side sleeper and changed to sleeping on her back, which has helped.  Sometimes, she also has burning sensation in the LEFT thigh, lower leg, and foot.  Tight shoes can trigger the burning in the foot.   She denies weakness, imbalance, or falls.    Out-side paper records, electronic medical record, and images have been reviewed where available and summarized as:  Lab Results  Component Value Date   HGBA1C 7.3 (H) 05/02/2024   Lab Results  Component Value Date   VITAMINB12 276 01/11/2024    Lab Results  Component Value Date   TSH 2.610 01/11/2024   No results found for: ESRSEDRATE, POCTSEDRATE  Past Medical History:  Diagnosis Date   GERD (gastroesophageal reflux disease)    Hypercholesteremia    Hypertension     Past Surgical History:  Procedure Laterality Date   CHOLECYSTECTOMY N/A 07/27/2021   Procedure: LAPAROSCOPIC CHOLECYSTECTOMY;  Surgeon: Evonnie Dorothyann LABOR, DO;  Location: [facility];  Service: General;  Laterality: N/A;  LM for pt to arrive at 8am   HYSTEROSCOPY W/ ENDOMETRIAL ABLATION  05/2011     Medications:  Outpatient Encounter Medications as of 05/05/2024  Medication Sig   amLODipine (NORVASC) 5 MG tablet Take 1 tablet (5 mg total) by mouth daily.   atorvastatin  (LIPITOR) 20 MG tablet Take 1 tablet (20 mg total) by mouth daily.   Blood Glucose Monitoring Suppl DEVI 1 each by Does not apply route in the morning, at noon, and at bedtime. May substitute to any manufacturer covered by patient's insurance.   Glucose Blood (BLOOD GLUCOSE TEST STRIPS) STRP 1 each by In Vitro route in the morning, at noon, and at bedtime. May substitute to any manufacturer covered by patient's insurance.DX E11.9   losartan  (COZAAR ) 100 MG tablet Take 1 tablet (100 mg total) by mouth daily.   MAGNESIUM PO Take by mouth.   omeprazole (PRILOSEC OTC) 20 MG tablet Take 20 mg by mouth daily as needed (heartburn).   No facility-administered encounter medications on file as of 05/05/2024.    Allergies:  Allergies  Allergen Reactions  Entex T  [Pseudoephedrine-Guaifenesin] Hives   Shellfish Allergy Nausea And Vomiting    Family History: Family History  Problem Relation Age of Onset   Diabetes Mother    Hypertension Mother    Thyroid disease Mother    Arthritis Mother    Liver disease Father        Liver issues   Thyroid disease Sister    Thyroid disease Brother    Diabetes Brother    Ovarian cancer Maternal Grandmother    Diabetes Maternal Grandmother     Hypertension Maternal Grandmother    Dementia Paternal Grandfather    Mental illness Son     Social History: Social History   Tobacco Use   Smoking status: Former    Types: Cigarettes   Smokeless tobacco: Never   Tobacco comments:    17 years ago  Vaping Use   Vaping status: Never Used  Substance Use Topics   Alcohol use: Not Currently   Drug use: Never   Social History   Social History Narrative   Are you right handed or left handed? Right handed    Are you currently employed ? Yes   What is your current occupation? [employer]    Do you live at home alone? No    Who lives with you?    What type of home do you live in: 1 story or 2 story? Lives in a two story home. Rarely uses upstairs         Vital Signs:  BP (!) 153/91   Pulse 98   Ht 5' 7 (1.702 m)   Wt (!) 324 lb (147 kg)   SpO2 99%   BMI 50.75 kg/m   Neurological Exam: MENTAL STATUS including orientation to time, place, person, recent and remote memory, attention span and concentration, language, and fund of knowledge is normal.  Speech is not dysarthric.  CRANIAL NERVES: II:  No visual field defects.     III-IV-VI: Pupils equal round and reactive to light.  Normal conjugate, extra-ocular eye movements in all directions of gaze.  No nystagmus.  No ptosis.   V:  Normal facial sensation.    VII:  Normal facial symmetry and movements.   VIII:  Normal hearing and vestibular function.   IX-X:  Normal palatal movement.   XI:  Normal shoulder shrug and head rotation.   XII:  Normal tongue strength and range of motion, no deviation or fasciculation.  MOTOR:  No atrophy, fasciculations or abnormal movements.  No pronator drift.   Upper Extremity:  Right  Left  Deltoid  5/5   5/5   Biceps  5/5   5/5   Triceps  5/5   5/5   Wrist extensors  5/5   5/5   Wrist flexors  5/5   5/5   Finger extensors  5/5   5/5   Finger flexors  5/5   5/5   Dorsal interossei  5/5   5/5   Abductor pollicis  5/5   5/5    Tone (Ashworth scale)  0  0   Lower Extremity:  Right  Left  Hip flexors  5/5   5/5   Knee flexors  5/5   5/5   Knee extensors  5/5   5/5   Dorsiflexors  5/5   5/5   Plantarflexors  5/5   5/5   Toe extensors  5/5   5/5   Toe flexors  5/5   5/5   Tone (Ashworth scale)  0  0   MSRs:                                           Right        Left brachioradialis 2+  2+  biceps 2+  2+  triceps 2+  2+  patellar 3+  3+  ankle jerk 2+  2+  Hoffman no  no  plantar response down  down   SENSORY:  Normal and symmetric perception of light touch, pinprick, and vibration.    COORDINATION/GAIT: Normal finger-to- nose-finger.  Intact rapid alternating movements bilaterally.  Able to rise from a chair without using arms.  Gait narrow based and stable. Tandem and stressed gait intact.     Thank you for allowing me to participate in patient's care.  If I can answer any additional questions, I would be pleased to do so.    Sincerely,    Rusell Meneely K. Tobie, DO

## 2024-08-07 ENCOUNTER — Ambulatory Visit: Admitting: Family Medicine
